# Patient Record
Sex: Female | Born: 1970 | Race: White | Hispanic: No | Marital: Married | Smoking: Never smoker
Health system: Southern US, Community
[De-identification: ages and names within clinical notes are randomized; demographics above are authoritative.]

## PROBLEM LIST (undated history)

## (undated) DIAGNOSIS — D649 Anemia, unspecified: Secondary | ICD-10-CM

## (undated) DIAGNOSIS — E079 Disorder of thyroid, unspecified: Secondary | ICD-10-CM

## (undated) HISTORY — DX: Anemia, unspecified: D64.9

## (undated) HISTORY — DX: Disorder of thyroid, unspecified: E07.9

## (undated) HISTORY — PX: BREAST BIOPSY: SHX20

---

## 2014-01-25 ENCOUNTER — Other Ambulatory Visit (HOSPITAL_COMMUNITY)
Admission: RE | Admit: 2014-01-25 | Discharge: 2014-01-25 | Disposition: A | Discharge status: Home / Self Care | Payer: BC Managed Care – PPO | Source: Ambulatory Visit | Attending: Obstetrics and Gynecology | Admitting: Obstetrics and Gynecology

## 2014-01-25 ENCOUNTER — Other Ambulatory Visit: Payer: Self-pay | Admitting: Obstetrics and Gynecology

## 2014-01-25 ENCOUNTER — Other Ambulatory Visit: Payer: Self-pay

## 2014-01-25 DIAGNOSIS — Z1151 Encounter for screening for human papillomavirus (HPV): Secondary | ICD-10-CM | POA: Insufficient documentation

## 2014-01-25 DIAGNOSIS — Z01419 Encounter for gynecological examination (general) (routine) without abnormal findings: Secondary | ICD-10-CM | POA: Diagnosis present

## 2014-01-25 DIAGNOSIS — Z1239 Encounter for other screening for malignant neoplasm of breast: Secondary | ICD-10-CM

## 2014-01-26 LAB — CYTOLOGY - PAP

## 2014-02-18 ENCOUNTER — Ambulatory Visit: Payer: Self-pay

## 2014-03-11 ENCOUNTER — Ambulatory Visit
Admission: RE | Admit: 2014-03-11 | Discharge: 2014-03-11 | Disposition: A | Discharge status: Home / Self Care | Payer: BC Managed Care – PPO | Source: Ambulatory Visit

## 2014-03-11 DIAGNOSIS — Z1239 Encounter for other screening for malignant neoplasm of breast: Secondary | ICD-10-CM

## 2014-03-12 ENCOUNTER — Ambulatory Visit (INDEPENDENT_AMBULATORY_CARE_PROVIDER_SITE_OTHER): Payer: BC Managed Care – PPO | Admitting: Internal Medicine

## 2014-03-12 ENCOUNTER — Encounter: Payer: Self-pay | Admitting: Internal Medicine

## 2014-03-12 VITALS — BP 102/68 | HR 80 | Temp 98.1°F | Resp 12 | Ht 64.0 in | Wt 130.0 lb

## 2014-03-12 DIAGNOSIS — E063 Autoimmune thyroiditis: Secondary | ICD-10-CM

## 2014-03-12 NOTE — Progress Notes (Signed)
Patient ID: Karen Guzman, female   DOB: 03/28/1971, 43 y.o.   MRN: [id number]   HPI  Karen Guzman is a 43 y.o.-year-old female, referred by her PCP, Dr. Dion BodyVarnado, for management of Hashimoto's hypothyroidism.  Pt. has been dx with hypothyroidism in 01/2014; not on Levothyroxine yet since asymptomatic.  I reviewed pt's thyroid tests: 02/01/2014:  TSH 4.89, TT4 7.8 TPO Abs 112 (<34) ATA 5.0 (<0.9)  Pt denies: - weight gain - fatigue - cold intolerancef - depression - constipation - dry skin - hair falling  She has regular, but heavy menses. She is on iron supplements.  Pt denies feeling nodules in neck, hoarseness, dysphagia/odynophagia, SOB with lying down.  She has + FH of thyroid disorders in: father, aunts, PGM. No FH of thyroid cancer.  No h/o radiation tx to head or neck. No recent use of iodine supplements.  ROS: Constitutional: no weight gain/loss, no fatigue, no subjective hyperthermia/hypothermia Eyes: no blurry vision, no xerophthalmia ENT: no sore throat, no nodules palpated in throat, no dysphagia/odynophagia, no hoarseness Cardiovascular: no CP/SOB/palpitations/leg swelling Respiratory: no cough/SOB Gastrointestinal: no N/V/D/C Musculoskeletal: no muscle/joint aches Skin: no rashes Neurological: no tremors/numbness/tingling/dizziness Psychiatric: no depression/anxiety  Past medical history: - Heavy menstrual cycles with the subsequent anemia  Past surgical history: - C-section in 2007 and 2009  History   Social History  . Marital Status: Married    Spouse Name: N/A    Number of Children: 2   Occupational History  .  stay at home mom    Social History Main Topics  . Smoking status: Never Smoker   . Smokeless tobacco: Not on file  . Alcohol Use:     3 -4 wine drinks per week  . Drug Use: No   Current Outpatient Rx  Name  Route  Sig  Dispense  Refill  . Fe Fum-FePoly-Vit C-Vit B3 (INTEGRA) 62.5-62.5-40-3 MG CAPS   Oral   Take 1 capsule  by mouth daily.      5   . tranexamic acid (LYSTEDA) 650 MG TABS tablet            11    Allergies  Allergen Reactions  . Penicillins Rash   Family history: - Negative for diabetes and hypertension - Positive for hyperlipidemia in father - Negative for heart problems - Positive for thyroid problems as mentioned in the history of present illness  PE: BP 102/68 mmHg  Pulse 80  Temp(Src) 98.1 F (36.7 C) (Oral)  Resp 12  Ht 5\' 4"  (1.626 m)  Wt 130 lb (58.968 kg)  BMI 22.30 kg/m2  SpO2 97%  LMP 03/02/2014 Wt Readings from Last 3 Encounters:  03/12/14 130 lb (58.968 kg)   Constitutional: Normal weight, in NAD Eyes: PERRLA, EOMI, no exophthalmos ENT: moist mucous membranes, no thyromegaly, no cervical lymphadenopathy Cardiovascular: RRR, No MRG Respiratory: CTA B Gastrointestinal: abdomen soft, NT, ND, BS+ Musculoskeletal: no deformities, strength intact in all 4 Skin: moist, warm, no rashes Neurological: no tremor with outstretched hands, DTR normal in all 4  ASSESSMENT: 1. Hashimoto's Hypothyroidism - Subclinical  PLAN:  1. Patient with newly diagnosed Hashimoto's subclinical hypothyroidism, not on levothyroxine therapy yet. She appears euthyroid and does not have any complaints. She does not appear to have a goiter, thyroid nodules, or neck compression symptoms. - We discussed about what Hashimoto's thyroiditis means; expected variability in her thyroid tests especially after diagnosis; treatment options - We also discussed about thresholds for treatment in subclinical hypothyroidism. I explained that without symptoms,  and without a TSH that higher than 10, thyroid hormones are not mandated for now. We definitely need to keep an eye on her thyroid tests since they can worsen due to the high antibody titer. - We discussed about correct intake of levothyroxine, if we need to start this in the future, fasting, with water, separated by at least 30 minutes from breakfast,  and separated by more than 4 hours from calcium, iron, multivitamins, acid reflux medications (PPIs). - will check thyroid tests in 04/2014, per her preference, since her insurance does not cover labs until the end of the year: TSH, free T4 - I will see her back in 6 months  - time spent with the patient: 45 min, of which >50% was spent in obtaining information about her symptoms, reviewing her previous labs, evaluations, and treatments, counseling her about her condition (please see the discussed topics above), and developing a plan to further investigate it. She had a number of questions which I addressed.

## 2014-03-12 NOTE — Patient Instructions (Signed)
Please come back for labs in 04/2014. Please come back for a follow-up appointment in 6 months. Let me know if you develop the followings:  Hypothyroidism The thyroid is a large gland located in the lower front of your neck. The thyroid gland helps control metabolism. Metabolism is how your body handles food. It controls metabolism with the hormone thyroxine. When this gland is underactive (hypothyroid), it produces too little hormone.  CAUSES These include:   Absence or destruction of thyroid tissue.  Goiter due to iodine deficiency.  Goiter due to medications.  Congenital defects (since birth).  Problems with the pituitary. This causes a lack of TSH (thyroid stimulating hormone). This hormone tells the thyroid to turn out more hormone. SYMPTOMS  Lethargy (feeling as though you have no energy)  Cold intolerance  Weight gain (in spite of normal food intake)  Dry skin  Coarse hair  Menstrual irregularity (if severe, may lead to infertility)  Slowing of thought processes Cardiac problems are also caused by insufficient amounts of thyroid hormone. Hypothyroidism in the newborn is cretinism, and is an extreme form. It is important that this form be treated adequately and immediately or it will lead rapidly to retarded physical and mental development. DIAGNOSIS  To prove hypothyroidism, your caregiver may do blood tests and ultrasound tests. Sometimes the signs are hidden. It may be necessary for your caregiver to watch this illness with blood tests either before or after diagnosis and treatment. TREATMENT  Low levels of thyroid hormone are increased by using synthetic thyroid hormone. This is a safe, effective treatment. It usually takes about four weeks to gain the full effects of the medication. After you have the full effect of the medication, it will generally take another four weeks for problems to leave. Your caregiver may start you on low doses. If you have had heart problems  the dose may be gradually increased. It is generally not an emergency to get rapidly to normal. HOME CARE INSTRUCTIONS   Take your medications as your caregiver suggests. Let your caregiver know of any medications you are taking or start taking. Your caregiver will help you with dosage schedules.  As your condition improves, your dosage needs may increase. It will be necessary to have continuing blood tests as suggested by your caregiver.  Report all suspected medication side effects to your caregiver. SEEK MEDICAL CARE IF: Seek medical care if you develop:  Sweating.  Tremulousness (tremors).  Anxiety.  Rapid weight loss.  Heat intolerance.  Emotional swings.  Diarrhea.  Weakness. SEEK IMMEDIATE MEDICAL CARE IF:  You develop chest pain, an irregular heart beat (palpitations), or a rapid heart beat. MAKE SURE YOU:   Understand these instructions.  Will watch your condition.  Will get help right away if you are not doing well or get worse. Document Released: 04/09/2005 Document Revised: 07/02/2011 Document Reviewed: 11/28/2007 [facility] Patient Information 2015 [place], [place]. This information is not intended to replace advice given to you by your health care provider. Make sure you discuss any questions you have with your health care provider.

## 2014-03-15 ENCOUNTER — Other Ambulatory Visit: Payer: Self-pay | Admitting: Obstetrics and Gynecology

## 2014-03-15 DIAGNOSIS — R928 Other abnormal and inconclusive findings on diagnostic imaging of breast: Secondary | ICD-10-CM

## 2014-03-17 ENCOUNTER — Encounter: Payer: Self-pay | Admitting: Internal Medicine

## 2014-03-29 ENCOUNTER — Ambulatory Visit
Admission: RE | Admit: 2014-03-29 | Discharge: 2014-03-29 | Disposition: A | Discharge status: Home / Self Care | Payer: BC Managed Care – PPO | Source: Ambulatory Visit | Attending: Obstetrics and Gynecology | Admitting: Obstetrics and Gynecology

## 2014-03-29 DIAGNOSIS — R928 Other abnormal and inconclusive findings on diagnostic imaging of breast: Secondary | ICD-10-CM

## 2014-04-30 ENCOUNTER — Other Ambulatory Visit: Payer: BLUE CROSS/BLUE SHIELD

## 2014-05-11 ENCOUNTER — Other Ambulatory Visit (INDEPENDENT_AMBULATORY_CARE_PROVIDER_SITE_OTHER): Payer: BLUE CROSS/BLUE SHIELD

## 2014-05-11 ENCOUNTER — Other Ambulatory Visit: Payer: Self-pay | Admitting: *Deleted

## 2014-05-11 DIAGNOSIS — E063 Autoimmune thyroiditis: Secondary | ICD-10-CM

## 2014-05-11 LAB — T4, FREE: Free T4: 0.81 ng/dL (ref 0.60–1.60)

## 2014-05-11 LAB — TSH: TSH: 5.07 u[IU]/mL — ABNORMAL HIGH (ref 0.35–4.50)

## 2014-05-11 LAB — T3, FREE: T3, Free: 3.2 pg/mL (ref 2.3–4.2)

## 2014-09-10 ENCOUNTER — Ambulatory Visit (INDEPENDENT_AMBULATORY_CARE_PROVIDER_SITE_OTHER): Payer: BLUE CROSS/BLUE SHIELD | Admitting: Internal Medicine

## 2014-09-10 ENCOUNTER — Encounter: Payer: Self-pay | Admitting: Internal Medicine

## 2014-09-10 VITALS — BP 122/80 | HR 81 | Temp 97.8°F | Resp 19 | Wt 120.0 lb

## 2014-09-10 DIAGNOSIS — Z7189 Other specified counseling: Secondary | ICD-10-CM

## 2014-09-10 DIAGNOSIS — E063 Autoimmune thyroiditis: Secondary | ICD-10-CM | POA: Diagnosis not present

## 2014-09-10 DIAGNOSIS — Z7689 Persons encountering health services in other specified circumstances: Secondary | ICD-10-CM

## 2014-09-10 NOTE — Patient Instructions (Signed)
Please stop at the lab. Please come back in 6 months for labs and in 1 year for a visit.  

## 2014-09-10 NOTE — Progress Notes (Signed)
Patient ID: Karen HoraKristin Stephanie, female   DOB: 01/22/1971, 44 y.o.   MRN: [id number]   HPI  Karen Guzman is a 44 y.o.-year-old female, returning for f/u for Hashimoto's hypothyroidism. Last visit 6 mo ago.   She started Isogenix shakes and bars - replacing 2/3 meals a day >> she lost 10 lbs.  Reviewed and addended hx: Pt. has been dx with hypothyroidism in 01/2014; not on Levothyroxine yet since asymptomatic.  I reviewed pt's thyroid tests: Lab Results  Component Value Date   TSH 5.07* 05/11/2014   FREET4 0.81 05/11/2014   02/01/2014:  TSH 4.89, TT4 7.8 TPO Abs 112 (<34) ATA 5.0 (<0.9)  Pt denies: - weight gain - fatigue - cold intolerancef - depression - constipation - dry skin - hair falling  She has regular, but heavy menses. She is on iron supplements.  Pt denies feeling nodules in neck, hoarseness, dysphagia/odynophagia, SOB with lying down.  ROS: Constitutional: no weight gain/loss, no fatigue, no subjective hyperthermia/hypothermia Eyes: no blurry vision, no xerophthalmia ENT: no sore throat, no nodules palpated in throat, no dysphagia/odynophagia, no hoarseness Cardiovascular: no CP/SOB/palpitations/leg swelling Respiratory: no cough/SOB Gastrointestinal: no N/V/D/C Musculoskeletal: no muscle/joint aches Skin: no rashes Neurological: no tremors/numbness/tingling/dizziness  I reviewed pt's medications, allergies, PMH, social hx, family hx, and changes were documented in the history of present illness. Otherwise, unchanged from my initial visit note.  Past medical history: - Heavy menstrual cycles with subsequent anemia  Past surgical history: - C-section in 2007 and 2009  History   Social History  . Marital Status: Married    Spouse Name: N/A    Number of Children: 2   Occupational History  .  stay at home mom    Social History Main Topics  . Smoking status: Never Smoker   . Smokeless tobacco: Not on file  . Alcohol Use:     3 -4 wine drinks per  week  . Drug Use: No   Current Outpatient Rx  Name  Route  Sig  Dispense  Refill  . Fe Fum-FePoly-Vit C-Vit B3 (INTEGRA) 62.5-62.5-40-3 MG CAPS   Oral   Take 1 capsule by mouth daily.      5   . tranexamic acid (LYSTEDA) 650 MG TABS tablet            11    Allergies  Allergen Reactions  . Penicillins Rash   Family history: - Negative for diabetes and hypertension - Positive for hyperlipidemia in father - Negative for heart problems - Positive for thyroid problems as mentioned in the history of present illness  PE: BP 122/80 mmHg  Pulse 81  Temp(Src) 97.8 F (36.6 C) (Oral)  Resp 19  Wt 120 lb (54.432 kg)  SpO2 98% Body mass index is 20.59 kg/(m^2). Wt Readings from Last 3 Encounters:  09/10/14 120 lb (54.432 kg)  03/12/14 130 lb (58.968 kg)   Constitutional: Normal weight, in NAD Eyes: PERRLA, EOMI, no exophthalmos ENT: moist mucous membranes, + symmetric, non-nodular, thyromegaly, no cervical lymphadenopathy Cardiovascular: RRR, No MRG Respiratory: CTA B Gastrointestinal: abdomen soft, NT, ND, BS+ Musculoskeletal: no deformities, strength intact in all 4 Skin: moist, warm, no rashes Neurological: no tremor with outstretched hands, DTR normal in all 4  ASSESSMENT: 1. Hashimoto's Hypothyroidism - Subclinical  PLAN:  1. Patient with mild Hashimoto's subclinical hypothyroidism, not on levothyroxine therapy yet. She appears euthyroid and does not have any complaints. 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 -  We discussed about what Hashimoto's thyroiditis means - We also discussed about thresholds for treatment in subclinical hypothyroidism. I explained that without symptoms, and without a TSH that higher than 10, thyroid hormones are not mandated for now, unless she desires a pregnancy. She was thinking about this and will let me know if she decides for it.  - For now, we'll recheck her thyroid tests: TSH,  free T4, free T3 - I will see her back in 1 year, but will recheck her labs in 6 months  Component     Latest Ref Rng 09/15/2014  TSH     0.350 - 4.500 uIU/mL 3.016  Free T4     0.80 - 1.80 ng/dL 1.611.44  T3, Free     2.3 - 4.2 pg/mL 2.8   TFTs normal. No treatment needed for now.

## 2014-09-15 ENCOUNTER — Other Ambulatory Visit: Payer: BLUE CROSS/BLUE SHIELD

## 2014-09-15 DIAGNOSIS — E063 Autoimmune thyroiditis: Secondary | ICD-10-CM

## 2014-09-15 LAB — T4, FREE: Free T4: 1.44 ng/dL (ref 0.80–1.80)

## 2014-09-15 LAB — T3, FREE: T3 FREE: 2.8 pg/mL (ref 2.3–4.2)

## 2014-09-15 LAB — TSH: TSH: 3.016 u[IU]/mL (ref 0.350–4.500)

## 2014-10-07 ENCOUNTER — Ambulatory Visit: Payer: BLUE CROSS/BLUE SHIELD | Admitting: Family

## 2014-11-09 ENCOUNTER — Ambulatory Visit: Payer: BLUE CROSS/BLUE SHIELD | Admitting: Internal Medicine

## 2014-11-19 ENCOUNTER — Ambulatory Visit: Payer: BLUE CROSS/BLUE SHIELD | Admitting: Internal Medicine

## 2014-12-08 ENCOUNTER — Encounter: Payer: Self-pay | Admitting: Internal Medicine

## 2014-12-08 ENCOUNTER — Other Ambulatory Visit (INDEPENDENT_AMBULATORY_CARE_PROVIDER_SITE_OTHER): Payer: BLUE CROSS/BLUE SHIELD

## 2014-12-08 ENCOUNTER — Ambulatory Visit (INDEPENDENT_AMBULATORY_CARE_PROVIDER_SITE_OTHER): Payer: BLUE CROSS/BLUE SHIELD | Admitting: Internal Medicine

## 2014-12-08 VITALS — BP 100/66 | HR 75 | Temp 98.4°F | Resp 12 | Ht 64.0 in | Wt 125.0 lb

## 2014-12-08 DIAGNOSIS — D500 Iron deficiency anemia secondary to blood loss (chronic): Secondary | ICD-10-CM

## 2014-12-08 DIAGNOSIS — E063 Autoimmune thyroiditis: Secondary | ICD-10-CM

## 2014-12-08 DIAGNOSIS — Z0000 Encounter for general adult medical examination without abnormal findings: Secondary | ICD-10-CM

## 2014-12-08 LAB — VITAMIN B12: Vitamin B-12: 294 pg/mL (ref 211–911)

## 2014-12-08 LAB — COMPREHENSIVE METABOLIC PANEL
ALBUMIN: 4.1 g/dL (ref 3.5–5.2)
ALK PHOS: 51 U/L (ref 39–117)
ALT: 12 U/L (ref 0–35)
AST: 18 U/L (ref 0–37)
BUN: 15 mg/dL (ref 6–23)
CHLORIDE: 104 meq/L (ref 96–112)
CO2: 27 mEq/L (ref 19–32)
Calcium: 9.4 mg/dL (ref 8.4–10.5)
Creatinine, Ser: 0.7 mg/dL (ref 0.40–1.20)
GFR: 96.56 mL/min (ref >60.00)
Glucose, Bld: 69 mg/dL — ABNORMAL LOW (ref 70–99)
POTASSIUM: 4.4 meq/L (ref 3.5–5.1)
SODIUM: 139 meq/L (ref 135–145)
Total Bilirubin: 0.4 mg/dL (ref 0.2–1.2)
Total Protein: 6.8 g/dL (ref 6.0–8.3)

## 2014-12-08 LAB — CBC
HEMATOCRIT: 41 % (ref 36.0–46.0)
Hemoglobin: 13.7 g/dL (ref 12.0–15.0)
MCHC: 33.4 g/dL (ref 30.0–36.0)
MCV: 89.8 fl (ref 78.0–100.0)
Platelets: 290 10*3/uL (ref 150.0–400.0)
RBC: 4.57 Mil/uL (ref 3.87–5.11)
RDW: 12.6 % (ref 11.5–15.5)
WBC: 8.1 10*3/uL (ref 4.0–10.5)

## 2014-12-08 LAB — FERRITIN: FERRITIN: 24.3 ng/mL (ref 10.0–291.0)

## 2014-12-08 LAB — LIPID PANEL
CHOLESTEROL: 153 mg/dL (ref 0–200)
HDL: 31.1 mg/dL — AB (ref >39.00)
LDL CALC: 90 mg/dL (ref 0–99)
NONHDL: 122.28
Total CHOL/HDL Ratio: 5
Triglycerides: 160 mg/dL — ABNORMAL HIGH (ref 0.0–149.0)
VLDL: 32 mg/dL (ref 0.0–40.0)

## 2014-12-08 LAB — T4, FREE: Free T4: 0.81 ng/dL (ref 0.60–1.60)

## 2014-12-08 LAB — TSH: TSH: 4.48 u[IU]/mL (ref 0.35–4.50)

## 2014-12-08 NOTE — Patient Instructions (Signed)
We are checking your labs today and will call you back with the results.

## 2014-12-08 NOTE — Progress Notes (Signed)
Pre visit review using our clinic review tool, if applicable. No additional management support is needed unless otherwise documented below in the visit note. 

## 2014-12-09 ENCOUNTER — Encounter: Payer: Self-pay | Admitting: Internal Medicine

## 2014-12-09 DIAGNOSIS — D509 Iron deficiency anemia, unspecified: Secondary | ICD-10-CM | POA: Insufficient documentation

## 2014-12-09 NOTE — Assessment & Plan Note (Signed)
Does have reports of heavy menstrual bleeding which has led to mild iron deficiency since puberty. She does not take her iron pills often. Check CBC, B12, ferritin today. No features that would suggest hemoglobinopathy. She is currently taking lysteda to reduce bleeding during cycles.

## 2014-12-09 NOTE — Assessment & Plan Note (Signed)
Seeing endocrinology and currently euthyroid. Checking TSH and free T4 today.

## 2014-12-09 NOTE — Progress Notes (Signed)
   Subjective:    Patient ID: Karen Guzman, female    DOB: 23-Oct-1970, 44 y.o.   MRN: [id number]  HPI The patient is a 44 YO female coming in new for anemia. She has had it since adulthood. She feels that she has heavy periods but she has been checked for fibroids recently by her Ob/Gyn and does not have. She is supposed to take iron pills but does not remember that often. She denies SOB or chest pains. She does feel tired some of the time. She has history of thyroid problems but is currently with normal thyroid levels off medication and is seeing endocrinology for that. No other acute complaints today. No history of excessive bleeding in joints or with dental procedures. No family history of any blood dyscrasias.   PMH, [facility], social history reviewed and updated.   Review of Systems  Constitutional: Positive for fatigue. Negative for fever, activity change, appetite change and unexpected weight change.  HENT: Negative.   Eyes: Negative.   Respiratory: Negative for cough, chest tightness, shortness of breath and wheezing.   Cardiovascular: Negative for chest pain, palpitations and leg swelling.  Gastrointestinal: Negative for nausea, abdominal pain, diarrhea, constipation, blood in stool and abdominal distention.  Musculoskeletal: Negative.   Skin: Negative.   Neurological: Negative.   Hematological: Negative.   Psychiatric/Behavioral: Negative.       Objective:   Physical Exam  Constitutional: She is oriented to person, place, and time. She appears well-developed and well-nourished.  HENT:  Head: Normocephalic and atraumatic.  Eyes: EOM are normal.  Neck: Normal range of motion.  Cardiovascular: Normal rate and regular rhythm.   No murmur heard. Pulmonary/Chest: Effort normal and breath sounds normal. No respiratory distress. She has no wheezes. She has no rales.  Abdominal: Soft. Bowel sounds are normal. She exhibits no distension. There is no tenderness. There is no rebound.    Musculoskeletal: She exhibits no edema.  Neurological: She is alert and oriented to person, place, and time. Coordination normal.  Skin: Skin is warm and dry.  Psychiatric: She has a normal mood and affect.   Filed Vitals:   12/08/14 0808  BP: 100/66  Pulse: 75  Temp: 98.4 F (36.9 C)  TempSrc: Oral  Resp: 12  Height:  (1.626 m)  Weight: 125 lb (56.7 kg)  SpO2: 98%      Assessment & Plan:

## 2015-03-01 ENCOUNTER — Encounter: Payer: Self-pay | Admitting: Family

## 2015-03-01 ENCOUNTER — Ambulatory Visit (INDEPENDENT_AMBULATORY_CARE_PROVIDER_SITE_OTHER): Payer: BLUE CROSS/BLUE SHIELD | Admitting: Family

## 2015-03-01 VITALS — BP 108/70 | HR 99 | Temp 98.2°F | Resp 18 | Ht 64.0 in | Wt 120.0 lb

## 2015-03-01 DIAGNOSIS — B349 Viral infection, unspecified: Secondary | ICD-10-CM | POA: Diagnosis not present

## 2015-03-01 NOTE — Assessment & Plan Note (Signed)
Symptoms and exam consistent with a viral syndrome. Influenza test was negative. Recommend conservative treatment with over the counter medications. Follow up if symptoms worsen or fail to improve.

## 2015-03-01 NOTE — Patient Instructions (Addendum)
Thank you for choosing [facility].  Summary/Instructions:  If your symptoms worsen or fail to improve, please contact our office for further instruction, or in case of emergency go directly to the emergency room at the closest medical facility.   General Recommendations:    Please drink plenty of fluids.  Get plenty of rest   Sleep in humidified air  Use saline nasal sprays  Netti pot   OTC Medications:  Decongestants - helps relieve congestion   Flonase (generic fluticasone) or Nasacort (generic triamcinolone) - please make sure to use the "cross-over" technique at a 45 degree angle towards the opposite eye as opposed to straight up the nasal passageway.   Sudafed (generic pseudoephedrine - Note this is the one that is available behind the pharmacy counter); Products with phenylephrine (-PE) may also be used but is often not as effective as pseudoephedrine.   If you have HIGH BLOOD PRESSURE - Coricidin HBP; AVOID any product that is -D as this contains pseudoephedrine which may increase your blood pressure.  Afrin (oxymetazoline) every 6-8 hours for up to 3 days.   Allergies - helps relieve runny nose, itchy eyes and sneezing   Claritin (generic loratidine), Allegra (fexofenidine), or Zyrtec (generic cyrterizine) for runny nose. These medications should not cause drowsiness.  Note - Benadryl (generic diphenhydramine) may be used however may cause drowsiness  Cough -   Delsym or Robitussin (generic dextromethorphan)  Expectorants - helps loosen mucus to ease removal   Mucinex (generic guaifenesin) as directed on the package.  Headaches / General Aches   Tylenol (generic acetaminophen) - DO NOT EXCEED 3 grams (3,000 mg) in a 24 hour time period  Advil/Motrin (generic ibuprofen)   Sore Throat -   Salt water gargle   Chloraseptic (generic benzocaine) spray or lozenges / Sucrets (generic dyclonine)       

## 2015-03-01 NOTE — Progress Notes (Signed)
   Subjective:    Patient ID: Karen Guzman, female    DOB: 07/28/1970, 44 y.o.   MRN: [id number]  Chief Complaint  Patient presents with  . Joint Pain    body aches, headache, chills, joint pain, started on sunday    HPI:  Karen Guzman is a 44 y.o. female who  has a past medical history of Anemia and Thyroid disease. and presents today for an acute office visit.   This is a new problem. Associated symptoms of body aches, headache, chills, and joint pain started approximately 3 days ago. Also notes some elbow pain that.  Modifying factors include aspirin which has provided a little relief. Reports that she does not feel as bad as she did yesterday or last evening. Denies any recent antibiotics. Denies anyone around her being sick.    Allergies  Allergen Reactions  . Penicillins Rash     No current outpatient prescriptions on file prior to visit.   No current facility-administered medications on file prior to visit.    Review of Systems  Constitutional: Positive for chills. Negative for fever.  HENT: Negative for congestion, ear pain, sinus pressure and sore throat.   Respiratory: Negative for chest tightness and shortness of breath.   Musculoskeletal: Positive for arthralgias.  Neurological: Positive for headaches.      Objective:    BP 108/70 mmHg  Pulse 99  Temp(Src) 98.2 F (36.8 C) (Oral)  Resp 18  Ht 5\' 4"  (1.626 m)  Wt 120 lb (54.432 kg)  BMI 20.59 kg/m2  SpO2 97% Nursing note and vital signs reviewed.  Physical Exam  Constitutional: She is oriented to person, place, and time. She appears well-developed and well-nourished. No distress.  HENT:  Right Ear: Hearing, tympanic membrane, external ear and ear canal normal.  Left Ear: Hearing, tympanic membrane, external ear and ear canal normal.  Nose: Nose normal. Right sinus exhibits no maxillary sinus tenderness and no frontal sinus tenderness. Left sinus exhibits no maxillary sinus tenderness and no frontal  sinus tenderness.  Mouth/Throat: Uvula is midline and mucous membranes are normal.  Small amount of cobblestoning noted in back of throat.   Cardiovascular: Normal rate, regular rhythm, normal heart sounds and intact distal pulses.   Pulmonary/Chest: Effort normal and breath sounds normal.  Neurological: She is alert and oriented to person, place, and time.  Skin: Skin is warm and dry.  Psychiatric: She has a normal mood and affect. Her behavior is normal. Judgment and thought content normal.       Assessment & Plan:   Problem List Items Addressed This Visit      Other   Viral syndrome - Primary    Symptoms and exam consistent with a viral syndrome. Influenza test was negative. Recommend conservative treatment with over the counter medications. Follow up if symptoms worsen or fail to improve.       Relevant Orders   POCT Influenza A/B

## 2015-03-01 NOTE — Progress Notes (Signed)
Pre visit review using our clinic review tool, if applicable. No additional management support is needed unless otherwise documented below in the visit note. 

## 2015-03-14 ENCOUNTER — Other Ambulatory Visit (INDEPENDENT_AMBULATORY_CARE_PROVIDER_SITE_OTHER): Payer: BLUE CROSS/BLUE SHIELD

## 2015-03-14 DIAGNOSIS — E063 Autoimmune thyroiditis: Secondary | ICD-10-CM | POA: Diagnosis not present

## 2015-03-14 LAB — TSH: TSH: 1.82 u[IU]/mL (ref 0.35–4.50)

## 2015-03-14 LAB — T3, FREE: T3, Free: 3 pg/mL (ref 2.3–4.2)

## 2015-03-14 LAB — T4, FREE: Free T4: 0.87 ng/dL (ref 0.60–1.60)

## 2015-04-01 ENCOUNTER — Other Ambulatory Visit: Payer: Self-pay

## 2015-04-01 DIAGNOSIS — Z1231 Encounter for screening mammogram for malignant neoplasm of breast: Secondary | ICD-10-CM

## 2015-05-04 ENCOUNTER — Ambulatory Visit: Payer: BLUE CROSS/BLUE SHIELD

## 2015-05-05 ENCOUNTER — Ambulatory Visit
Admission: RE | Admit: 2015-05-05 | Discharge: 2015-05-05 | Disposition: A | Discharge status: Home / Self Care | Payer: BLUE CROSS/BLUE SHIELD | Source: Ambulatory Visit

## 2015-05-05 DIAGNOSIS — Z1231 Encounter for screening mammogram for malignant neoplasm of breast: Secondary | ICD-10-CM

## 2015-09-12 ENCOUNTER — Ambulatory Visit (INDEPENDENT_AMBULATORY_CARE_PROVIDER_SITE_OTHER): Payer: BLUE CROSS/BLUE SHIELD | Admitting: Internal Medicine

## 2015-09-12 ENCOUNTER — Encounter: Payer: Self-pay | Admitting: Internal Medicine

## 2015-09-12 VITALS — BP 118/64 | HR 83 | Temp 98.1°F | Resp 12 | Wt 129.8 lb

## 2015-09-12 DIAGNOSIS — E049 Nontoxic goiter, unspecified: Secondary | ICD-10-CM

## 2015-09-12 DIAGNOSIS — E063 Autoimmune thyroiditis: Secondary | ICD-10-CM | POA: Diagnosis not present

## 2015-09-12 DIAGNOSIS — E038 Other specified hypothyroidism: Secondary | ICD-10-CM

## 2015-09-12 DIAGNOSIS — E010 Iodine-deficiency related diffuse (endemic) goiter: Secondary | ICD-10-CM

## 2015-09-12 LAB — T3, FREE: T3 FREE: 3.3 pg/mL (ref 2.3–4.2)

## 2015-09-12 LAB — T4, FREE: FREE T4: 0.83 ng/dL (ref 0.60–1.60)

## 2015-09-12 LAB — TSH: TSH: 4.34 u[IU]/mL (ref 0.35–4.50)

## 2015-09-12 NOTE — Patient Instructions (Signed)
Please stop at the lab.  Come back in 6 months for repeat thyroid tests.   We will call you to schedule the thyroid U/S. Please let me know in ~5 days if you are not called with the schedule.  Please return in 1 year.

## 2015-09-12 NOTE — Progress Notes (Addendum)
Patient ID: Karen Guzman, female   DOB: 10-09-1970, 45 y.o.   MRN: [id number]   HPI  Karen Guzman is a 45 y.o.-year-old female, returning for f/u for Hashimoto's hypothyroidism. Last visit 6 mo ago.   She started [employer] shakes and bars - relaxed diet since last visit.   Reviewed and addended hx: Pt. has been dx with hypothyroidism in 01/2014; not on Levothyroxine yet since asymptomatic.  I reviewed pt's thyroid tests: Lab Results  Component Value Date   TSH 1.82 03/14/2015   TSH 4.48 12/08/2014   TSH 3.016 09/15/2014   TSH 5.07* 05/11/2014   FREET4 0.87 03/14/2015   FREET4 0.81 12/08/2014   FREET4 1.44 09/15/2014   FREET4 0.81 05/11/2014   02/01/2014:  TSH 4.89, TT4 7.8 TPO Abs 112 (<34) ATA 5.0 (<0.9)  Pt denies: - weight gain - fatigue - cold intolerance - depression - constipation - dry skin - hair falling  She has regular, but heavy menses. She is on iron supplements.  Pt denies feeling nodules in neck, hoarseness, dysphagia/odynophagia, SOB with lying down.  ROS: Constitutional: no weight gain/loss, no fatigue, no subjective hyperthermia/hypothermia Eyes: no blurry vision, no xerophthalmia ENT: no sore throat, no nodules palpated in throat, no dysphagia/odynophagia, no hoarseness Cardiovascular: no CP/SOB/palpitations/leg swelling Respiratory: no cough/SOB Gastrointestinal: no N/V/D/C Musculoskeletal: no muscle/joint aches Skin: no rashes Neurological: no tremors/numbness/tingling/dizziness  I reviewed pt's medications, allergies, PMH, social hx, family hx, and changes were documented in the history of present illness. Otherwise, unchanged from my initial visit note.  Past medical history: - Heavy menstrual cycles with subsequent anemia  Past surgical history: - C-section in 2007 and 2009  History   Social History  . Marital Status: Married    Spouse Name: N/A    Number of Children: 2   Occupational History  .  stay at home mom    Social  History Main Topics  . Smoking status: Never Smoker   . Smokeless tobacco: Not on file  . Alcohol Use:     3 -4 wine drinks per week  . Drug Use: No   No current outpatient prescriptions on file. Allergies  Allergen Reactions  . Penicillins Rash   Family history: - Negative for diabetes and hypertension - Positive for hyperlipidemia in father - Negative for heart problems - Positive for thyroid problems as mentioned in the history of present illness  PE: BP 118/64 mmHg  Pulse 83  Temp(Src) 98.1 F (36.7 C) (Oral)  Resp 12  Wt 129 lb 12.8 oz (58.877 kg)  SpO2 98% Body mass index is 22.27 kg/(m^2). Wt Readings from Last 3 Encounters:  09/12/15 129 lb 12.8 oz (58.877 kg)  03/01/15 120 lb (54.432 kg)  12/08/14 125 lb (56.7 kg)   Constitutional: Normal weight, in NAD Eyes: PERRLA, EOMI, no exophthalmos ENT: moist mucous membranes, + symmetric, non-nodular, thyromegaly, no cervical lymphadenopathy Cardiovascular: RRR, No MRG Respiratory: CTA B Gastrointestinal: abdomen soft, NT, ND, BS+ Musculoskeletal: no deformities, strength intact in all 4 Skin: moist, warm, no rashes Neurological: no tremor with outstretched hands, DTR normal in all 4  ASSESSMENT: 1. Hashimoto's Hypothyroidism  2. Thyromegaly  PLAN:  1. Patient with mild Hashimoto's subclinical hypothyroidism, not on levothyroxine therapy yet. She appears euthyroid and does not have any complaints. 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 - We discussed about what Hashimoto's thyroiditis means - thresholds for treatment in subclinical hypothyroidism:  TSH that higher than 10  Signs  and sxs of hypothyroidism  She desires a pregnancy - For now, we'll recheck her thyroid tests: TSH, free T4, free T3 - I will see her back in 1 year, but will recheck her labs in 6 months  2. Thyromegaly - she is concerned >> will check a thyroid U/S  Office Visit on  09/12/2015  Component Date Value Ref Range Status  . T3, Free 09/12/2015 3.3  2.3 - 4.2 pg/mL Final  . Free T4 09/12/2015 0.83  0.60 - 1.60 ng/dL Final  . TSH 82/95/621305/22/2017 4.34  0.35 - 4.50 uIU/mL Final   TFTs normal.  CLINICAL DATA: Thyromegaly, Hashimoto's thyroiditis  EXAM: THYROID ULTRASOUND  TECHNIQUE: Ultrasound examination of the thyroid gland and adjacent soft tissues was performed.  COMPARISON: None.  FINDINGS: Right thyroid lobe  Measurements: 5.9 x 1.9 x 1.8 cm. Inhomogeneous parenchyma without focal nodule or other focal lesion. Mild hyperemia.  Left thyroid lobe  Measurements: 5 x 1.7 x 1.6 cm. No nodules visualized.  Isthmus  Thickness: 0.3 cm. No nodules visualized.  Lymphadenopathy  None visualized. Bilateral cervical nodes are identified, none greater than 7 mm short axis diameter.  IMPRESSION: 1. Mild thyromegaly without focal nodule.   Electronically Signed By: Corlis Leak Hassell M.D. On: 09/16/2015 13:35

## 2015-09-16 ENCOUNTER — Ambulatory Visit
Admission: RE | Admit: 2015-09-16 | Discharge: 2015-09-16 | Disposition: A | Discharge status: Home / Self Care | Payer: BLUE CROSS/BLUE SHIELD | Source: Ambulatory Visit | Attending: Internal Medicine | Admitting: Internal Medicine

## 2015-09-16 DIAGNOSIS — E063 Autoimmune thyroiditis: Secondary | ICD-10-CM | POA: Diagnosis not present

## 2015-09-16 DIAGNOSIS — E010 Iodine-deficiency related diffuse (endemic) goiter: Secondary | ICD-10-CM

## 2015-09-22 ENCOUNTER — Telehealth: Payer: Self-pay | Admitting: Internal Medicine

## 2015-09-22 NOTE — Telephone Encounter (Signed)
PT calling to get her lab and U/S results, requests call back

## 2015-09-23 NOTE — Telephone Encounter (Signed)
Karen Guzman, please start her to check her my chart - I sent her message her week ago about this:  Entered by Carlus Pavlovristina Lycan Davee, MD at 09/16/2015 1:46 PM    Dear Ms. Olena LeatherwoodJarrett  Your thyroid gland is slightly enlarged, but without any nodules or other worrisome lesions.  Sincerely,  Carlus Pavlovristina Taten Merrow MD     For  patients that call for results, check first if they have a my chart active, because chances are I have already send him the results through there and they did not look yet.

## 2015-09-23 NOTE — Telephone Encounter (Signed)
Left message for Ms. Karen Guzman to check her MyChart.  Dr. Lafe GarinGherge sent her the results last week.  Advised to call back if she has any questions.

## 2016-01-31 DIAGNOSIS — Z01419 Encounter for gynecological examination (general) (routine) without abnormal findings: Secondary | ICD-10-CM | POA: Diagnosis not present

## 2016-01-31 DIAGNOSIS — Z23 Encounter for immunization: Secondary | ICD-10-CM | POA: Diagnosis not present

## 2016-03-12 ENCOUNTER — Other Ambulatory Visit (INDEPENDENT_AMBULATORY_CARE_PROVIDER_SITE_OTHER): Payer: BLUE CROSS/BLUE SHIELD

## 2016-03-12 DIAGNOSIS — E038 Other specified hypothyroidism: Secondary | ICD-10-CM

## 2016-03-12 DIAGNOSIS — E063 Autoimmune thyroiditis: Secondary | ICD-10-CM

## 2016-03-12 LAB — TSH: TSH: 2.81 u[IU]/mL (ref 0.35–4.50)

## 2016-03-12 LAB — T3, FREE: T3 FREE: 3.2 pg/mL (ref 2.3–4.2)

## 2016-03-12 LAB — T4, FREE: FREE T4: 0.79 ng/dL (ref 0.60–1.60)

## 2016-03-14 ENCOUNTER — Other Ambulatory Visit: Payer: BLUE CROSS/BLUE SHIELD

## 2016-04-06 ENCOUNTER — Other Ambulatory Visit: Payer: Self-pay | Admitting: Obstetrics and Gynecology

## 2016-04-06 DIAGNOSIS — Z1231 Encounter for screening mammogram for malignant neoplasm of breast: Secondary | ICD-10-CM

## 2016-05-15 ENCOUNTER — Ambulatory Visit
Admission: RE | Admit: 2016-05-15 | Discharge: 2016-05-15 | Disposition: A | Discharge status: Home / Self Care | Payer: BLUE CROSS/BLUE SHIELD | Source: Ambulatory Visit | Attending: Obstetrics and Gynecology | Admitting: Obstetrics and Gynecology

## 2016-05-15 DIAGNOSIS — Z1231 Encounter for screening mammogram for malignant neoplasm of breast: Secondary | ICD-10-CM | POA: Diagnosis not present

## 2016-07-13 ENCOUNTER — Telehealth: Payer: Self-pay

## 2016-07-13 DIAGNOSIS — R5383 Other fatigue: Secondary | ICD-10-CM | POA: Diagnosis not present

## 2016-07-13 NOTE — Telephone Encounter (Signed)
Patient called, stated that she has recently started feeling very fatigued and she is becoming very forgetful. She went to the urgent care today and they did blood work and will have those results back tomorrow and will get them faxed over. She is going out of town next week for 2 weeks and was afraid that something is off. She just wants you to look over her labs and see if she needs to be on medicine and advise her what to do. Thank you!

## 2016-09-11 ENCOUNTER — Ambulatory Visit: Payer: BLUE CROSS/BLUE SHIELD | Admitting: Internal Medicine

## 2016-09-21 ENCOUNTER — Encounter: Payer: Self-pay | Admitting: Internal Medicine

## 2016-09-21 ENCOUNTER — Other Ambulatory Visit (INDEPENDENT_AMBULATORY_CARE_PROVIDER_SITE_OTHER): Payer: BLUE CROSS/BLUE SHIELD

## 2016-09-21 ENCOUNTER — Ambulatory Visit (INDEPENDENT_AMBULATORY_CARE_PROVIDER_SITE_OTHER): Payer: BLUE CROSS/BLUE SHIELD | Admitting: Internal Medicine

## 2016-09-21 VITALS — BP 93/62 | HR 78 | Wt 136.2 lb

## 2016-09-21 DIAGNOSIS — E063 Autoimmune thyroiditis: Secondary | ICD-10-CM | POA: Diagnosis not present

## 2016-09-21 LAB — T3, FREE: T3 FREE: 3.1 pg/mL (ref 2.3–4.2)

## 2016-09-21 LAB — T4, FREE: Free T4: 0.78 ng/dL (ref 0.60–1.60)

## 2016-09-21 LAB — TSH: TSH: 3.08 u[IU]/mL (ref 0.35–4.50)

## 2016-09-21 NOTE — Patient Instructions (Signed)
[facility] stop at [facility].  Please return in 1 year.

## 2016-09-21 NOTE — Progress Notes (Signed)
Patient ID: Karen HoraKristin Gordner, female   DOB: 12/25/1970, [age] y.o.   MRN: [id number]   HPI  Karen Guzman is a 46 y.o.-year-old female, returning for f/u for Hashimoto's hypothyroidism. Last visit 6 mo ago.   Reviewed and addended hx: Pt. has been dx with hypothyroidism in 01/2014; not on Levothyroxine yet since euthyroid.  I reviewed pt's thyroid tests: Lab Results  Component Value Date   TSH 2.81 03/12/2016   TSH 4.34 09/12/2015   TSH 1.82 03/14/2015   TSH 4.48 12/08/2014   TSH 3.016 09/15/2014   FREET4 0.79 03/12/2016   FREET4 0.83 09/12/2015   FREET4 0.87 03/14/2015   FREET4 0.81 12/08/2014   FREET4 1.44 09/15/2014   02/01/2014:  TSH 4.89, TT4 7.8 TPO Abs 112 (<34) ATA 5.0 (<0.9)  Pt denies - weight gain, although she did gain weight per our scale - cold intolerance - constipation - dry skin - hair loss  She did have an episode of increased fatigue in 06/2016.  + regular, but heavy menses. She is on iron supplements.  Pt denies: - feeling nodules in neck - hoarseness - dysphagia - choking - SOB with lying down  We checked a thyroid U/S (09/16/2015): Mild thyromegaly without focal nodule.   Since last visit, she had hives after eating out in a [place] restaurant. Last episode few mo ago.  ROS: Constitutional: + per HPI Eyes: no blurry vision, no xerophthalmia ENT: no sore throat, no nodules palpated in throat, no dysphagia, no odynophagia, no hoarseness Cardiovascular: no CP/no SOB/no palpitations/no leg swelling Respiratory: no cough/no SOB/no wheezing Gastrointestinal: no N/no V/no D/no C/no acid reflux Musculoskeletal: no muscle aches/no joint aches Skin: no rashes, no hair loss Neurological: no tremors/no numbness/no tingling/no dizziness  I reviewed pt's medications, allergies, PMH, social hx, family hx, and changes were documented in the history of present illness. Otherwise, unchanged from my initial visit note.  Past medical history: - Heavy  menstrual cycles with subsequent anemia  Past surgical history: - C-section in 2007 and 2009  History   Social History  . Marital Status: Married    Spouse Name: N/A    Number of Children: 2   Occupational History  .  stay at home mom    Social History Main Topics  . Smoking status: Never Smoker   . Smokeless tobacco: Not on file  . Alcohol Use:     3 -4 wine drinks per week  . Drug Use: No    Allergies  Allergen Reactions  . Penicillins Rash   Family history: - Negative for diabetes and hypertension - Positive for hyperlipidemia in father - Negative for heart problems - Positive for thyroid problems as mentioned in the history of present illness  PE: BP 93/62   Pulse 78   Wt 136 lb 3.2 oz (61.8 kg)   SpO2 96%   BMI 23.38 kg/m  Body mass index is 23.38 kg/m. Wt Readings from Last 3 Encounters:  09/21/16 136 lb 3.2 oz (61.8 kg)  09/12/15 129 lb 12.8 oz (58.9 kg)  03/01/15 120 lb (54.4 kg)   Constitutional:  Normal weight, in NAD Eyes: PERRLA, EOMI, no exophthalmos ENT: moist mucous membranes, + mild symmetric thyromegaly, no cervical lymphadenopathy Cardiovascular: RRR, No MRG Respiratory: CTA B Gastrointestinal: abdomen soft, NT, ND, BS+ Musculoskeletal: no deformities, strength intact in all 4 Skin: moist, warm, no rashes Neurological: no tremor with outstretched hands, DTR normal in all 4   ASSESSMENT: 1. Hashimoto's Hypothyroidism  2. Thyromegaly  PLAN:  1. Patient with mild Hashimoto's thyroiditis, in euthyroid phase. She had an episode of fatigue in 06/2016, but this resolved. She does not c/o weight gain but her weight is higher per our scale. No constipation, no fatigue now, no dry skin or hair loss. - we reviewed her latest thyroid U/S report >> mild goiter, w/o clinically signif. Nodules. - she denies neck compression sxs - thresholds for treatment in subclinical hypothyroidism: are:  TSH >10  Signs or sxs of hypothyroidism  Desire for  pregnancy - Ffor now, we can continue w/o treatment but may need to start LT4 depending on the results of her TFTs today - I will see her back in 1 year  2. Thyromegaly - no neck compression sxs - a thyroid U/S obtained 1 year ago >> no nodules: Mild thyromegaly without focal nodule.  Component     Latest Ref Rng & Units 09/21/2016  TSH     0.35 - 4.50 uIU/mL 3.08  T4,Free(Direct)     0.60 - 1.60 ng/dL 1.61  Triiodothyronine,Free,Serum     2.3 - 4.2 pg/mL 3.1  Normal TFTs.  Carlus Pavlov, MD PhD [facility] Endocrin[facility]

## 2017-01-17 ENCOUNTER — Encounter: Payer: Self-pay | Admitting: Internal Medicine

## 2017-01-17 ENCOUNTER — Other Ambulatory Visit: Payer: Self-pay | Admitting: Internal Medicine

## 2017-01-17 DIAGNOSIS — E063 Autoimmune thyroiditis: Secondary | ICD-10-CM

## 2017-01-21 ENCOUNTER — Other Ambulatory Visit (INDEPENDENT_AMBULATORY_CARE_PROVIDER_SITE_OTHER): Payer: BLUE CROSS/BLUE SHIELD

## 2017-01-21 DIAGNOSIS — E063 Autoimmune thyroiditis: Secondary | ICD-10-CM

## 2017-01-21 LAB — T4, FREE: Free T4: 0.88 ng/dL (ref 0.60–1.60)

## 2017-01-21 LAB — T3, FREE: T3, Free: 3.4 pg/mL (ref 2.3–4.2)

## 2017-01-21 LAB — TSH: TSH: 4.26 u[IU]/mL (ref 0.35–4.50)

## 2017-02-05 ENCOUNTER — Other Ambulatory Visit: Payer: Self-pay | Admitting: Obstetrics and Gynecology

## 2017-02-05 ENCOUNTER — Other Ambulatory Visit (HOSPITAL_COMMUNITY)
Admission: RE | Admit: 2017-02-05 | Discharge: 2017-02-05 | Disposition: A | Discharge status: Home / Self Care | Payer: BLUE CROSS/BLUE SHIELD | Source: Ambulatory Visit | Attending: Obstetrics and Gynecology | Admitting: Obstetrics and Gynecology

## 2017-02-05 DIAGNOSIS — N852 Hypertrophy of uterus: Secondary | ICD-10-CM | POA: Diagnosis not present

## 2017-02-05 DIAGNOSIS — Z01419 Encounter for gynecological examination (general) (routine) without abnormal findings: Secondary | ICD-10-CM | POA: Diagnosis not present

## 2017-02-05 DIAGNOSIS — Z23 Encounter for immunization: Secondary | ICD-10-CM | POA: Diagnosis not present

## 2017-02-05 DIAGNOSIS — Z1231 Encounter for screening mammogram for malignant neoplasm of breast: Secondary | ICD-10-CM

## 2017-02-05 DIAGNOSIS — Z124 Encounter for screening for malignant neoplasm of cervix: Secondary | ICD-10-CM | POA: Insufficient documentation

## 2017-02-05 DIAGNOSIS — D509 Iron deficiency anemia, unspecified: Secondary | ICD-10-CM | POA: Diagnosis not present

## 2017-02-06 DIAGNOSIS — Z23 Encounter for immunization: Secondary | ICD-10-CM | POA: Diagnosis not present

## 2017-02-07 LAB — CYTOLOGY - PAP
Diagnosis: NEGATIVE
HPV: NOT DETECTED

## 2017-02-11 DIAGNOSIS — N83291 Other ovarian cyst, right side: Secondary | ICD-10-CM | POA: Diagnosis not present

## 2017-02-11 DIAGNOSIS — N920 Excessive and frequent menstruation with regular cycle: Secondary | ICD-10-CM | POA: Diagnosis not present

## 2017-02-11 DIAGNOSIS — N852 Hypertrophy of uterus: Secondary | ICD-10-CM | POA: Diagnosis not present

## 2017-02-20 DIAGNOSIS — D225 Melanocytic nevi of trunk: Secondary | ICD-10-CM | POA: Diagnosis not present

## 2017-02-20 DIAGNOSIS — D2271 Melanocytic nevi of right lower limb, including hip: Secondary | ICD-10-CM | POA: Diagnosis not present

## 2017-02-20 DIAGNOSIS — L821 Other seborrheic keratosis: Secondary | ICD-10-CM | POA: Diagnosis not present

## 2017-02-20 DIAGNOSIS — D2272 Melanocytic nevi of left lower limb, including hip: Secondary | ICD-10-CM | POA: Diagnosis not present

## 2017-03-28 DIAGNOSIS — N83291 Other ovarian cyst, right side: Secondary | ICD-10-CM | POA: Diagnosis not present

## 2017-04-11 DIAGNOSIS — D509 Iron deficiency anemia, unspecified: Secondary | ICD-10-CM | POA: Diagnosis not present

## 2017-04-11 DIAGNOSIS — Z0000 Encounter for general adult medical examination without abnormal findings: Secondary | ICD-10-CM | POA: Diagnosis not present

## 2017-05-16 ENCOUNTER — Ambulatory Visit: Payer: BLUE CROSS/BLUE SHIELD

## 2017-06-04 DIAGNOSIS — D500 Iron deficiency anemia secondary to blood loss (chronic): Secondary | ICD-10-CM | POA: Diagnosis not present

## 2017-06-04 DIAGNOSIS — N920 Excessive and frequent menstruation with regular cycle: Secondary | ICD-10-CM | POA: Diagnosis not present

## 2017-06-07 ENCOUNTER — Ambulatory Visit
Admission: RE | Admit: 2017-06-07 | Discharge: 2017-06-07 | Disposition: A | Discharge status: Home / Self Care | Payer: BLUE CROSS/BLUE SHIELD | Source: Ambulatory Visit | Attending: Obstetrics and Gynecology | Admitting: Obstetrics and Gynecology

## 2017-06-07 DIAGNOSIS — Z1231 Encounter for screening mammogram for malignant neoplasm of breast: Secondary | ICD-10-CM

## 2017-09-23 ENCOUNTER — Ambulatory Visit: Payer: BLUE CROSS/BLUE SHIELD | Admitting: Internal Medicine

## 2017-09-23 ENCOUNTER — Encounter: Payer: Self-pay | Admitting: Internal Medicine

## 2017-09-23 VITALS — BP 110/60 | HR 83 | Ht 63.75 in | Wt 133.4 lb

## 2017-09-23 DIAGNOSIS — E063 Autoimmune thyroiditis: Secondary | ICD-10-CM | POA: Diagnosis not present

## 2017-09-23 LAB — TSH: TSH: 4.41 u[IU]/mL (ref 0.35–4.50)

## 2017-09-23 LAB — T3, FREE: T3 FREE: 3.4 pg/mL (ref 2.3–4.2)

## 2017-09-23 LAB — T4, FREE: FREE T4: 0.85 ng/dL (ref 0.60–1.60)

## 2017-09-23 NOTE — Patient Instructions (Addendum)
Please stop at the lab.  If you need to start Levothyroxine, take it every, with water, at least 30 minutes before breakfast, separated by at least 4 hours from: - acid reflux medications - calcium - iron - multivitamins  Please return in 1 year.

## 2017-09-23 NOTE — Progress Notes (Signed)
Patient ID: Karen Guzman, female   DOB: 05/10/1970, 47 y.o.   MRN: [id number]   HPI  Karen HoraKristin Guzman is a 47 y.o.-year-old female, returning for f/u for Hashimoto's hypothyroidism. Last visit 1 year ago.  Reviewed and addended history: Pt. has been dx with hypothyroidism in 01/2014; not on levothyroxine yet since euthyroid.  Reviewed patient's TFTs: Lab Results  Component Value Date   TSH 4.26 01/21/2017   TSH 3.08 09/21/2016   TSH 2.81 03/12/2016   TSH 4.34 09/12/2015   TSH 1.82 03/14/2015   FREET4 0.88 01/21/2017   FREET4 0.78 09/21/2016   FREET4 0.79 03/12/2016   FREET4 0.83 09/12/2015   FREET4 0.87 03/14/2015   02/01/2014  -elevated antibodies:  TSH 4.89, TT4 7.8 TPO Abs 112 (<34) ATA 5.0 (<0.9)  She has regular, but heavy menses.  She was iron supplements.  Pt denies: - feeling nodules in neck - hoarseness - dysphagia - choking - SOB with lying down  Thyroid U/S (09/16/2015): Mild thyromegaly without focal nodule.   ROS: Constitutional: no weight gain/no weight loss, no fatigue, no subjective hyperthermia, no subjective hypothermia Eyes: no blurry vision, no xerophthalmia ENT: no sore throat, + see HPI Cardiovascular: no CP/no SOB/no palpitations/no leg swelling Respiratory: no cough/no SOB/no wheezing Gastrointestinal: no N/no V/no D/no C/no acid reflux Musculoskeletal: no muscle aches/no joint aches Skin: no rashes, no hair loss Neurological: no tremors/no numbness/no tingling/no dizziness  I reviewed pt's medications, allergies, PMH, social hx, family hx, and changes were documented in the history of present illness. Otherwise, unchanged from my initial visit note.  Past medical history: - Heavy menstrual cycles with subsequent anemia  Past surgical history: - C-section in 2007 and 2009  History   Social History  . Marital Status: Married    Spouse Name: N/A    Number of Children: 2   Occupational History  .  stay at home mom    Social  History Main Topics  . Smoking status: Never Smoker   . Smokeless tobacco: Not on file  . Alcohol Use:     3 -4 wine drinks per week  . Drug Use: No    Allergies  Allergen Reactions  . Penicillins Rash   Family history: - Negative for diabetes and hypertension - Positive for hyperlipidemia in father - Negative for heart problems - Positive for thyroid problems as mentioned in the history of present illness  PE: BP 110/60   Pulse 83   Ht 5' 3.75" (1.619 m)   Wt 133 lb 6.4 oz (60.5 kg)   LMP 09/09/2017   SpO2 98%   BMI 23.08 kg/m  Body mass index is 23.08 kg/m. Wt Readings from Last 3 Encounters:  09/23/17 133 lb 6.4 oz (60.5 kg)  09/21/16 136 lb 3.2 oz (61.8 kg)  09/12/15 129 lb 12.8 oz (58.9 kg)   Constitutional: Normal weight, in NAD Eyes: PERRLA, EOMI, no exophthalmos ENT: moist mucous membranes, + mild, symmetric thyromegaly, no cervical lymphadenopathy Cardiovascular: RRR, No MRG Respiratory: CTA B Gastrointestinal: abdomen soft, NT, ND, BS+ Musculoskeletal: no deformities, strength intact in all 4 Skin: moist, warm, no rashes Neurological: no tremor with outstretched hands, DTR normal in all 4  ASSESSMENT: 1. Hashimoto's Hypothyroidism  2. Thyromegaly  PLAN:  1. Patient with mild Hashimoto's thyroiditis, euthyroid.  She denies hypothyroid symptoms: Weight gain, constipation, fatigue, dry skin, hair loss. - Latest TFTs were reviewed from 6 mo ago and they were normal - We will recheck her TFTs today and discussed about  the possible need to start levothyroxine if these are abnormal.  I advised her how to take the levothyroxine correctly, in case we need to start: Daily, fasting, with water, separated by at least 30 minutes from breakfast and at least 4 hours from calcium, iron, multivitamins, acid reflux medications. - However, we may not need treatment if she has subclinical hypothyroidism - criteria for treatment are:  TSH >10  Signs and symptoms of  hypothyroidism  Desire for pregnancy -I will have her stop at the lab for TSH, free T4, free T3 - I will see her back in 1 year  2. Thyromegaly -She denies any neck compression symptoms  -reviewed the report of her latest thyroid ultrasound obtained 2 years ago mild thyromegaly without focal nodule  Office Visit on 09/23/2017  Component Date Value Ref Range Status  . TSH 09/23/2017 4.41  0.35 - 4.50 uIU/mL Final  . Free T4 09/23/2017 0.85  0.60 - 1.60 ng/dL Final   Comment: Specimens from patients who are undergoing biotin therapy and /or ingesting biotin supplements may contain high levels of biotin.  The higher biotin concentration in these specimens interferes with this Free T4 assay.  Specimens that contain high levels  of biotin may cause false high results for this Free T4 assay.  Please interpret results in light of the total clinical presentation of the patient.    . T3, Free 09/23/2017 3.4  2.3 - 4.2 pg/mL Final   Thyroid test normal, she does not require levothyroxine yet.  Carlus Pavlov, MD PhD [facility] Endocrinology

## 2017-12-03 DIAGNOSIS — R509 Fever, unspecified: Secondary | ICD-10-CM | POA: Diagnosis not present

## 2017-12-03 DIAGNOSIS — D500 Iron deficiency anemia secondary to blood loss (chronic): Secondary | ICD-10-CM | POA: Diagnosis not present

## 2017-12-03 DIAGNOSIS — R5383 Other fatigue: Secondary | ICD-10-CM | POA: Diagnosis not present

## 2017-12-03 DIAGNOSIS — E063 Autoimmune thyroiditis: Secondary | ICD-10-CM | POA: Diagnosis not present

## 2018-01-21 DIAGNOSIS — D225 Melanocytic nevi of trunk: Secondary | ICD-10-CM | POA: Diagnosis not present

## 2018-01-21 DIAGNOSIS — D2261 Melanocytic nevi of right upper limb, including shoulder: Secondary | ICD-10-CM | POA: Diagnosis not present

## 2018-01-21 DIAGNOSIS — D2262 Melanocytic nevi of left upper limb, including shoulder: Secondary | ICD-10-CM | POA: Diagnosis not present

## 2018-01-21 DIAGNOSIS — D2272 Melanocytic nevi of left lower limb, including hip: Secondary | ICD-10-CM | POA: Diagnosis not present

## 2018-02-05 DIAGNOSIS — Z23 Encounter for immunization: Secondary | ICD-10-CM | POA: Diagnosis not present

## 2018-02-25 DIAGNOSIS — N898 Other specified noninflammatory disorders of vagina: Secondary | ICD-10-CM | POA: Diagnosis not present

## 2018-02-25 DIAGNOSIS — N920 Excessive and frequent menstruation with regular cycle: Secondary | ICD-10-CM | POA: Diagnosis not present

## 2018-02-25 DIAGNOSIS — R3915 Urgency of urination: Secondary | ICD-10-CM | POA: Diagnosis not present

## 2018-02-25 DIAGNOSIS — Z01411 Encounter for gynecological examination (general) (routine) with abnormal findings: Secondary | ICD-10-CM | POA: Diagnosis not present

## 2018-02-27 ENCOUNTER — Other Ambulatory Visit: Payer: Self-pay | Admitting: Obstetrics and Gynecology

## 2018-02-27 DIAGNOSIS — N6320 Unspecified lump in the left breast, unspecified quadrant: Secondary | ICD-10-CM

## 2018-03-05 ENCOUNTER — Ambulatory Visit
Admission: RE | Admit: 2018-03-05 | Discharge: 2018-03-05 | Disposition: A | Discharge status: Home / Self Care | Payer: BLUE CROSS/BLUE SHIELD | Source: Ambulatory Visit | Attending: Obstetrics and Gynecology | Admitting: Obstetrics and Gynecology

## 2018-03-05 DIAGNOSIS — R922 Inconclusive mammogram: Secondary | ICD-10-CM | POA: Diagnosis not present

## 2018-03-05 DIAGNOSIS — N6320 Unspecified lump in the left breast, unspecified quadrant: Secondary | ICD-10-CM

## 2018-03-05 DIAGNOSIS — N6012 Diffuse cystic mastopathy of left breast: Secondary | ICD-10-CM | POA: Diagnosis not present

## 2018-04-07 DIAGNOSIS — Z30430 Encounter for insertion of intrauterine contraceptive device: Secondary | ICD-10-CM | POA: Diagnosis not present

## 2018-04-07 DIAGNOSIS — Z3202 Encounter for pregnancy test, result negative: Secondary | ICD-10-CM | POA: Diagnosis not present

## 2018-05-01 DIAGNOSIS — Z0000 Encounter for general adult medical examination without abnormal findings: Secondary | ICD-10-CM | POA: Diagnosis not present

## 2018-05-14 DIAGNOSIS — Z0000 Encounter for general adult medical examination without abnormal findings: Secondary | ICD-10-CM | POA: Diagnosis not present

## 2018-05-14 DIAGNOSIS — E7800 Pure hypercholesterolemia, unspecified: Secondary | ICD-10-CM | POA: Diagnosis not present

## 2018-05-19 DIAGNOSIS — Z30431 Encounter for routine checking of intrauterine contraceptive device: Secondary | ICD-10-CM | POA: Diagnosis not present

## 2018-09-16 ENCOUNTER — Other Ambulatory Visit: Payer: Self-pay

## 2018-09-17 ENCOUNTER — Ambulatory Visit: Payer: BLUE CROSS/BLUE SHIELD | Admitting: Internal Medicine

## 2018-09-17 ENCOUNTER — Other Ambulatory Visit: Payer: Self-pay

## 2018-09-17 ENCOUNTER — Encounter: Payer: Self-pay | Admitting: Internal Medicine

## 2018-09-17 VITALS — BP 110/70 | HR 92 | Ht 63.75 in | Wt 140.0 lb

## 2018-09-17 DIAGNOSIS — E063 Autoimmune thyroiditis: Secondary | ICD-10-CM | POA: Diagnosis not present

## 2018-09-17 LAB — TSH: TSH: 3.62 u[IU]/mL (ref 0.35–4.50)

## 2018-09-17 LAB — T3, FREE: T3, Free: 3.1 pg/mL (ref 2.3–4.2)

## 2018-09-17 LAB — T4, FREE: Free T4: 0.76 ng/dL (ref 0.60–1.60)

## 2018-09-17 NOTE — Progress Notes (Signed)
Patient ID: [name], female   DOB: 08-24-1970, 48 y.o.   MRN: [id number]   HPI  Karen Guzman is a 48 y.o.-year-old female, returning for f/u for Hashimoto's hypothyroidism. Last visit 1 year ago.  Reviewed and addended history: Pt. has been dx with hypothyroidism in 01/2014; we did not start levothyroxine yet since she is euthyroid.  Reviewed patient's TFTs: Lab Results  Component Value Date   TSH 4.41 09/23/2017   TSH 4.26 01/21/2017   TSH 3.08 09/21/2016   TSH 2.81 03/12/2016   TSH 4.34 09/12/2015   FREET4 0.85 09/23/2017   FREET4 0.88 01/21/2017   FREET4 0.78 09/21/2016   FREET4 0.79 03/12/2016   FREET4 0.83 09/12/2015   02/01/2014  -elevated antibodies:  TSH 4.89, TT4 7.8 TPO Abs 112 (<34) ATA 5.0 (<0.9)  She has regular, but heavy menses.  She was on iron supplements. Now on Mirena since 03/2018 >> no menses now.  Pt denies: - feeling nodules in neck - hoarseness - dysphagia - choking - SOB with lying down  Thyroid U/S (09/16/2015):  Mild thyromegaly without focal nodule  ROS: Constitutional: no weight gain/no weight loss, no fatigue, no subjective hyperthermia, no subjective hypothermia Eyes: no blurry vision, no xerophthalmia ENT: no sore throat, + see HPI Cardiovascular: no CP/no SOB/no palpitations/no leg swelling Respiratory: no cough/no SOB/no wheezing Gastrointestinal: no N/no V/no D/no C/no acid reflux Musculoskeletal: no muscle aches/no joint aches Skin: no rashes, no hair loss Neurological: no tremors/no numbness/no tingling/no dizziness  I reviewed pt's medications, allergies, PMH, social hx, family hx, and changes were documented in the history of present illness. Otherwise, unchanged from my initial visit note.  Past medical history: - Heavy menstrual cycles with subsequent anemia  Past surgical history: - C-section in 2007 and 2009  History   Social History  . Marital Status: Married    Spouse Name: N/A    Number of  Children: 2   Occupational History  .  stay at home mom    Social History Main Topics  . Smoking status: Never Smoker   . Smokeless tobacco: Not on file  . Alcohol Use:     3 -4 wine drinks per week  . Drug Use: No    Allergies  Allergen Reactions  . Penicillins Rash   Family history: - Negative for diabetes and hypertension - Positive for hyperlipidemia in father - Negative for heart problems - Positive for thyroid problems as mentioned in the history of present illness  PE: BP 110/70   Pulse 92   Ht 5' 3.75" (1.619 m)   Wt 140 lb (63.5 kg)   SpO2 98%   BMI 24.22 kg/m  Body mass index is 24.22 kg/m. Wt Readings from Last 3 Encounters:  09/17/18 140 lb (63.5 kg)  09/23/17 133 lb 6.4 oz (60.5 kg)  09/21/16 136 lb 3.2 oz (61.8 kg)   Constitutional: Normal weight, in NAD Eyes: PERRLA, EOMI, no exophthalmos ENT: moist mucous membranes, + mild, symmetric, thyromegaly, no cervical lymphadenopathy Cardiovascular: RRR, No MRG Respiratory: CTA B Gastrointestinal: abdomen soft, NT, ND, BS+ Musculoskeletal: no deformities, strength intact in all 4 Skin: moist, warm, no rashes Neurological: no tremor with outstretched hands, DTR normal in all 4  ASSESSMENT: 1. Hashimoto's Hypothyroidism  2. Thyromegaly  PLAN:  1. Patient with mild Hashimoto's thyroiditis, euthyroid. -She denies hypothyroid symptoms: Weight gain, constipation, fatigue, dry skin, hair loss -Latest TFTs were reviewed from 09/2017 and they were normal -At this visit, we will recheck  her TFTs and discussed about the possible need to start levothyroxine if these are abnormal.  In case we need to start, we discussed that she needs to take the medication daily, fasting, with water, separated by at least 30 minutes from breakfast and at least 4 hours from calcium, iron, multivitamins, and acid reflux medications. -We again discussed that we may not need treatment if she has subclinical hypothyroidism and criteria  for treatment are:  TSH >10  Signs and symptoms of hypothyroidism  Desire for pregnancy -Today we will check her TSH, free T4, free T3 -I will see her back in 1 year, but possibly sooner for labs  2. Thyromegaly -She denies any neck compression symptoms -Reviewed the report of her latest thyroid ultrasound obtained 3 years ago: Mild thyromegaly without focal nodule  - time spent with the patient: 15 min, of which >50% was spent in obtaining information about her symptoms, reviewing her previous labs, evaluations, and treatments, counseling her about her conditions (please see the discussed topics above), and developing a plan to further investigate and treat it.   Office Visit on 09/17/2018  Component Date Value Ref Range Status  . TSH 09/17/2018 3.62  0.35 - 4.50 uIU/mL Final  . Free T4 09/17/2018 0.76  0.60 - 1.60 ng/dL Final   Comment: Specimens from patients who are undergoing biotin therapy and /or ingesting biotin supplements may contain high levels of biotin.  The higher biotin concentration in these specimens interferes with this Free T4 assay.  Specimens that contain high levels  of biotin may cause false high results for this Free T4 assay.  Please interpret results in light of the total clinical presentation of the patient.    . T3, Free 09/17/2018 3.1  2.3 - 4.2 pg/mL Final   Normal TFTs. TSH improved. No need to start Levothyroxine.  Karen Pavlovristina Adelee Hannula, MD PhD [facility] Endocrin[facility]

## 2018-09-17 NOTE — Patient Instructions (Signed)
Please stop at the lab.  Please come back for a follow-up appointment in 1 year.  

## 2018-09-25 ENCOUNTER — Ambulatory Visit: Payer: BLUE CROSS/BLUE SHIELD | Admitting: Internal Medicine

## 2019-02-10 DIAGNOSIS — Z23 Encounter for immunization: Secondary | ICD-10-CM | POA: Diagnosis not present

## 2019-02-24 ENCOUNTER — Other Ambulatory Visit: Payer: Self-pay

## 2019-02-24 DIAGNOSIS — Z20822 Contact with and (suspected) exposure to covid-19: Secondary | ICD-10-CM

## 2019-02-24 DIAGNOSIS — J40 Bronchitis, not specified as acute or chronic: Secondary | ICD-10-CM | POA: Diagnosis not present

## 2019-02-25 LAB — NOVEL CORONAVIRUS, NAA: SARS-CoV-2, NAA: DETECTED — AB

## 2019-02-26 ENCOUNTER — Ambulatory Visit: Payer: Self-pay

## 2019-02-26 NOTE — Telephone Encounter (Signed)
Provided covid lab results to patient.  Provided care advice to patient.  Voiced understanding.

## 2019-04-28 ENCOUNTER — Other Ambulatory Visit: Payer: Self-pay | Admitting: Obstetrics and Gynecology

## 2019-04-28 DIAGNOSIS — Z01419 Encounter for gynecological examination (general) (routine) without abnormal findings: Secondary | ICD-10-CM | POA: Diagnosis not present

## 2019-04-28 DIAGNOSIS — Z1231 Encounter for screening mammogram for malignant neoplasm of breast: Secondary | ICD-10-CM

## 2019-05-05 DIAGNOSIS — Z0000 Encounter for general adult medical examination without abnormal findings: Secondary | ICD-10-CM | POA: Diagnosis not present

## 2019-05-14 DIAGNOSIS — H52222 Regular astigmatism, left eye: Secondary | ICD-10-CM | POA: Diagnosis not present

## 2019-05-14 DIAGNOSIS — H524 Presbyopia: Secondary | ICD-10-CM | POA: Diagnosis not present

## 2019-05-14 DIAGNOSIS — H5213 Myopia, bilateral: Secondary | ICD-10-CM | POA: Diagnosis not present

## 2019-05-20 DIAGNOSIS — Z23 Encounter for immunization: Secondary | ICD-10-CM | POA: Diagnosis not present

## 2019-05-20 DIAGNOSIS — D509 Iron deficiency anemia, unspecified: Secondary | ICD-10-CM | POA: Diagnosis not present

## 2019-05-20 DIAGNOSIS — E7889 Other lipoprotein metabolism disorders: Secondary | ICD-10-CM | POA: Diagnosis not present

## 2019-05-20 DIAGNOSIS — Z0000 Encounter for general adult medical examination without abnormal findings: Secondary | ICD-10-CM | POA: Diagnosis not present

## 2019-06-09 ENCOUNTER — Ambulatory Visit
Admission: RE | Admit: 2019-06-09 | Discharge: 2019-06-09 | Disposition: A | Discharge status: Home / Self Care | Payer: BC Managed Care – PPO | Source: Ambulatory Visit | Attending: Obstetrics and Gynecology | Admitting: Obstetrics and Gynecology

## 2019-06-09 ENCOUNTER — Other Ambulatory Visit: Payer: Self-pay

## 2019-06-09 DIAGNOSIS — Z1231 Encounter for screening mammogram for malignant neoplasm of breast: Secondary | ICD-10-CM | POA: Diagnosis not present

## 2019-06-15 DIAGNOSIS — D509 Iron deficiency anemia, unspecified: Secondary | ICD-10-CM | POA: Diagnosis not present

## 2019-07-10 DIAGNOSIS — Z1159 Encounter for screening for other viral diseases: Secondary | ICD-10-CM | POA: Diagnosis not present

## 2019-07-15 DIAGNOSIS — K635 Polyp of colon: Secondary | ICD-10-CM | POA: Diagnosis not present

## 2019-07-15 DIAGNOSIS — D509 Iron deficiency anemia, unspecified: Secondary | ICD-10-CM | POA: Diagnosis not present

## 2019-07-15 DIAGNOSIS — K2940 Chronic atrophic gastritis without bleeding: Secondary | ICD-10-CM | POA: Diagnosis not present

## 2019-07-15 DIAGNOSIS — K3189 Other diseases of stomach and duodenum: Secondary | ICD-10-CM | POA: Diagnosis not present

## 2019-07-15 DIAGNOSIS — K2930 Chronic superficial gastritis without bleeding: Secondary | ICD-10-CM | POA: Diagnosis not present

## 2019-07-15 DIAGNOSIS — K6289 Other specified diseases of anus and rectum: Secondary | ICD-10-CM | POA: Diagnosis not present

## 2019-07-15 DIAGNOSIS — K317 Polyp of stomach and duodenum: Secondary | ICD-10-CM | POA: Diagnosis not present

## 2019-07-30 DIAGNOSIS — D509 Iron deficiency anemia, unspecified: Secondary | ICD-10-CM | POA: Diagnosis not present

## 2019-08-06 DIAGNOSIS — E538 Deficiency of other specified B group vitamins: Secondary | ICD-10-CM | POA: Diagnosis not present

## 2019-08-13 DIAGNOSIS — E538 Deficiency of other specified B group vitamins: Secondary | ICD-10-CM | POA: Diagnosis not present

## 2019-08-20 DIAGNOSIS — E538 Deficiency of other specified B group vitamins: Secondary | ICD-10-CM | POA: Diagnosis not present

## 2019-09-01 DIAGNOSIS — E538 Deficiency of other specified B group vitamins: Secondary | ICD-10-CM | POA: Diagnosis not present

## 2019-09-08 DIAGNOSIS — D485 Neoplasm of uncertain behavior of skin: Secondary | ICD-10-CM | POA: Diagnosis not present

## 2019-09-08 DIAGNOSIS — Z1283 Encounter for screening for malignant neoplasm of skin: Secondary | ICD-10-CM | POA: Diagnosis not present

## 2019-09-08 DIAGNOSIS — L570 Actinic keratosis: Secondary | ICD-10-CM | POA: Diagnosis not present

## 2019-09-08 DIAGNOSIS — D2261 Melanocytic nevi of right upper limb, including shoulder: Secondary | ICD-10-CM | POA: Diagnosis not present

## 2019-09-08 DIAGNOSIS — X32XXXA Exposure to sunlight, initial encounter: Secondary | ICD-10-CM | POA: Diagnosis not present

## 2019-09-08 DIAGNOSIS — D225 Melanocytic nevi of trunk: Secondary | ICD-10-CM | POA: Diagnosis not present

## 2019-09-14 DIAGNOSIS — W57XXXA Bitten or stung by nonvenomous insect and other nonvenomous arthropods, initial encounter: Secondary | ICD-10-CM | POA: Diagnosis not present

## 2019-09-14 DIAGNOSIS — E538 Deficiency of other specified B group vitamins: Secondary | ICD-10-CM | POA: Diagnosis not present

## 2019-09-14 DIAGNOSIS — R21 Rash and other nonspecific skin eruption: Secondary | ICD-10-CM | POA: Diagnosis not present

## 2019-09-17 ENCOUNTER — Ambulatory Visit: Payer: BLUE CROSS/BLUE SHIELD | Admitting: Internal Medicine

## 2019-09-18 DIAGNOSIS — K2940 Chronic atrophic gastritis without bleeding: Secondary | ICD-10-CM | POA: Diagnosis not present

## 2019-09-18 DIAGNOSIS — D510 Vitamin B12 deficiency anemia due to intrinsic factor deficiency: Secondary | ICD-10-CM | POA: Diagnosis not present

## 2019-09-22 DIAGNOSIS — E538 Deficiency of other specified B group vitamins: Secondary | ICD-10-CM | POA: Diagnosis not present

## 2019-10-02 DIAGNOSIS — D510 Vitamin B12 deficiency anemia due to intrinsic factor deficiency: Secondary | ICD-10-CM | POA: Diagnosis not present

## 2019-11-04 ENCOUNTER — Telehealth: Payer: Self-pay

## 2019-11-04 ENCOUNTER — Other Ambulatory Visit (INDEPENDENT_AMBULATORY_CARE_PROVIDER_SITE_OTHER): Payer: BC Managed Care – PPO

## 2019-11-04 ENCOUNTER — Other Ambulatory Visit: Payer: Self-pay

## 2019-11-04 DIAGNOSIS — E063 Autoimmune thyroiditis: Secondary | ICD-10-CM

## 2019-11-04 DIAGNOSIS — D509 Iron deficiency anemia, unspecified: Secondary | ICD-10-CM | POA: Diagnosis not present

## 2019-11-04 LAB — T3, FREE: T3, Free: 2.8 pg/mL (ref 2.3–4.2)

## 2019-11-04 LAB — T4, FREE: Free T4: 0.84 ng/dL (ref 0.60–1.60)

## 2019-11-04 LAB — TSH: TSH: 3.24 u[IU]/mL (ref 0.35–4.50)

## 2019-11-04 NOTE — Telephone Encounter (Signed)
Patient called to discuss the MyChart messages she has been exchanging with [place] regarding lab work. I have reviewed her chart and do not see any messages from [place].  Patient states she is not feeling well and wants to have thyroid labs now rather than wait until her appointment in Sept.   I let her know Dr. Elvera Lennox does not usually order labs until the patient comes in for the appointment, patient then states " That is not what Dr. Elvera Lennox told me, she said to just call when I feel bad and she will order labs.)  Please advise.

## 2019-11-04 NOTE — Telephone Encounter (Signed)
Yes, that is correct - we can check thyroid labs now.  Let us order a TSH, free T4, free T3.

## 2019-11-04 NOTE — Telephone Encounter (Signed)
Labs ordered and patient notified.  Lab appt today for 2 PM.

## 2019-12-04 DIAGNOSIS — D510 Vitamin B12 deficiency anemia due to intrinsic factor deficiency: Secondary | ICD-10-CM | POA: Diagnosis not present

## 2019-12-04 DIAGNOSIS — E538 Deficiency of other specified B group vitamins: Secondary | ICD-10-CM | POA: Diagnosis not present

## 2020-01-01 ENCOUNTER — Ambulatory Visit: Payer: BC Managed Care – PPO | Admitting: Internal Medicine

## 2020-01-01 NOTE — Progress Notes (Deleted)
Patient ID: Karen Guzman, female   DOB: 03/04/1971, 49 y.o.   MRN: [id number]  This visit occurred during the SARS-CoV-2 public health emergency.  Safety protocols were in place, including screening questions prior to the visit, additional usage of staff PPE, and extensive cleaning of exam room while observing appropriate contact time as indicated for disinfecting solutions.   HPI  Karen Guzman is a 49 y.o.-year-old female, returning for f/u for Hashimoto's hypothyroidism. Last visit 1 year and 3.5 months ago.  Since last visit, she developed COVID-19 infection in 02/2019.  Reviewed history: Pt. has been dx with hypothyroidism in 01/2014; we did not start levothyroxine yet since she is euthyroid.  Reviewed her TFTs: Lab Results  Component Value Date   TSH 3.24 11/04/2019   TSH 3.62 09/17/2018   TSH 4.41 09/23/2017   TSH 4.26 01/21/2017   TSH 3.08 09/21/2016   FREET4 0.84 11/04/2019   FREET4 0.76 09/17/2018   FREET4 0.85 09/23/2017   FREET4 0.88 01/21/2017   FREET4 0.78 09/21/2016   02/01/2014  -elevated antibodies:  TSH 4.89, TT4 7.8 TPO Abs 112 (<34) ATA 5.0 (<0.9)  She had regular but heavy menses.  Previously on iron supplements. Now on Mirena since 03/2018 >> no menses.  Pt denies: - feeling nodules in neck - hoarseness - dysphagia - choking - SOB with lying down  Thyroid U/S (09/16/2015): Mild thyromegaly without focal nodule  ROS: Constitutional: no weight gain/no weight loss, no fatigue, no subjective hyperthermia, no subjective hypothermia Eyes: no blurry vision, no xerophthalmia ENT: no sore throat, + see HPI Cardiovascular: no CP/no SOB/no palpitations/no leg swelling Respiratory: no cough/no SOB/no wheezing Gastrointestinal: no N/no V/no D/no C/no acid reflux Musculoskeletal: no muscle aches/no joint aches Skin: no rashes, no hair loss Neurological: no tremors/no numbness/no tingling/no dizziness  I reviewed pt's medications, allergies, PMH,  social hx, family hx, and changes were documented in the history of present illness. Otherwise, unchanged from my initial visit note.  Past medical history: - Heavy menstrual cycles with subsequent anemia  Past surgical history: - C-section in 2007 and 2009  History   Social History  . Marital Status: Married    Spouse Name: N/A    Number of Children: 2   Occupational History  .  stay at home mom    Social History Main Topics  . Smoking status: Never Smoker   . Smokeless tobacco: Not on file  . Alcohol Use:     3 -4 wine drinks per week  . Drug Use: No    Allergies  Allergen Reactions  . Penicillins Rash   Family history: - Negative for diabetes and hypertension - Positive for hyperlipidemia in father - Negative for heart problems - Positive for thyroid problems as mentioned in the history of present illness  PE: There were no vitals taken for this visit. There is no height or weight on file to calculate BMI. Wt Readings from Last 3 Encounters:  09/17/18 140 lb (63.5 kg)  09/23/17 133 lb 6.4 oz (60.5 kg)  09/21/16 136 lb 3.2 oz (61.8 kg)   Constitutional: normal weight, in NAD Eyes: PERRLA, EOMI, no exophthalmos ENT: moist mucous membranes, + mild symmetric thyromegaly, no cervical lymphadenopathy Cardiovascular: RRR, No MRG Respiratory: CTA B Gastrointestinal: abdomen soft, NT, ND, BS+ Musculoskeletal: no deformities, strength intact in all 4 Skin: moist, warm, no rashes Neurological: no tremor with outstretched hands, DTR normal in all 4  ASSESSMENT: 1. Hashimoto's Hypothyroidism  2. Thyromegaly  PLAN:  1. Patient with mild Hashimoto's thyroiditis, euthyroid -At last visit she denied hypothyroid symptoms: Weight gain, constipation, fatigue, dry skin, hair loss.  However, she called [place] that she was not feeling well and we repeated TFTs 2 months ago. -Reviewed her labs from 10/2019 and these were normal -At this visit, we will recheck her TFTs and  discussed about the possible need to start levothyroxine if these are abnormal.  We discussed about how to take levothyroxine in case she needs to start in the future: every day, with water, at least 30 minutes before breakfast, separated by at least 4 hours from: - acid reflux medications - calcium - iron - multivitamins -We again discussed about criteria for treatment for subclinical hypothyroidism:  TSH higher than 10  Desire for pregnancy  Signs and symptoms consistent with hypothyroidism -As of now, she does not appear to need levothyroxine treatment -I will see her back in 1 year, but possibly sooner for labs  2. Thyromegaly -On palpation -She denies neck compression symptoms -Reviewed the report of the latest thyroid ultrasound from 2017: Mild thyromegaly without focal nodule  Carlus Pavlov, MD PhD [facility]

## 2020-01-04 ENCOUNTER — Ambulatory Visit (INDEPENDENT_AMBULATORY_CARE_PROVIDER_SITE_OTHER): Payer: BC Managed Care – PPO | Admitting: Internal Medicine

## 2020-01-04 ENCOUNTER — Encounter: Payer: Self-pay | Admitting: Internal Medicine

## 2020-01-04 ENCOUNTER — Other Ambulatory Visit: Payer: Self-pay

## 2020-01-04 VITALS — BP 112/70 | HR 96 | Ht 63.75 in | Wt 125.0 lb

## 2020-01-04 DIAGNOSIS — L659 Nonscarring hair loss, unspecified: Secondary | ICD-10-CM

## 2020-01-04 DIAGNOSIS — E063 Autoimmune thyroiditis: Secondary | ICD-10-CM | POA: Diagnosis not present

## 2020-01-04 NOTE — Progress Notes (Signed)
Patient ID: Karen Guzman, female   DOB: June 15, 1970, 49 y.o.   MRN: [id number]  This visit occurred during the SARS-CoV-2 public health emergency.  Safety protocols were in place, including screening questions prior to the visit, additional usage of staff PPE, and extensive cleaning of exam room while observing appropriate contact time as indicated for disinfecting solutions.   HPI  Karen Guzman is a 49 y.o.-year-old female, returning for f/u for Hashimoto's hypothyroidism.  Last visit was 1 year and 3.5 months ago.  Since last visit, she had COVID-19 infection in 02/2019.  Reviewed history: Pt. has been dx with hypothyroidism in 01/2014; we did not start levothyroxine yet since she is euthyroid.  Reviewed her TFTs: Lab Results  Component Value Date   TSH 3.24 11/04/2019   TSH 3.62 09/17/2018   TSH 4.41 09/23/2017   TSH 4.26 01/21/2017   TSH 3.08 09/21/2016   FREET4 0.84 11/04/2019   FREET4 0.76 09/17/2018   FREET4 0.85 09/23/2017   FREET4 0.88 01/21/2017   FREET4 0.78 09/21/2016   02/01/2014  -elevated antibiotics:  TSH 4.89, TT4 7.8 TPO Abs 112 (<34) ATA 5.0 (<0.9)  Pt denies: - feeling nodules in neck - hoarseness - dysphagia - choking - SOB with lying down  Thyroid U/S (09/16/2015): Mild thyromegaly without focal nodule  She had regular but heavy menses.  Previously on iron supplements. Now on Mirena since 03/2018 >> no menses. Now on B12.  She complains of hair loss.  This started ~08/2019 and is generalized.  She feels that lately it has been improving some.  She bought biotin but did not start yet.  ROS: Constitutional: no weight gain/+ weight loss - Optivia , no fatigue, no subjective hyperthermia, no subjective hypothermia Eyes: no blurry vision, no xerophthalmia ENT: no sore throat, + see HPI Cardiovascular: no CP/no SOB/no palpitations/no leg swelling Respiratory: no cough/no SOB/no wheezing Gastrointestinal: no N/no V/no D/no C/no acid  reflux Musculoskeletal: no muscle aches/no joint aches Skin: no rashes, + hair loss Neurological: no tremors/no numbness/no tingling/no dizziness  I reviewed pt's medications, allergies, PMH, social hx, family hx, and changes were documented in the history of present illness. Otherwise, unchanged from my initial visit note.  Past medical history: - Heavy menstrual cycles with subsequent anemia  Past surgical history: - C-section in 2007 and 2009  History   Social History  . Marital Status: Married    Spouse Name: N/A    Number of Children: 2   Occupational History  .  stay at home mom    Social History Main Topics  . Smoking status: Never Smoker   . Smokeless tobacco: Not on file  . Alcohol Use:     3 -4 wine drinks per week  . Drug Use: No    Allergies  Allergen Reactions  . Penicillins Rash   Family history: - Negative for diabetes and hypertension - Positive for hyperlipidemia in father - Negative for heart problems - Positive for thyroid problems as mentioned in the history of present illness  PE: BP 112/70   Pulse 96   Ht 5' 3.75" (1.619 m)   Wt 125 lb (56.7 kg)   SpO2 98%   BMI 21.62 kg/m  Body mass index is 21.62 kg/m. Wt Readings from Last 3 Encounters:  01/04/20 125 lb (56.7 kg)  09/17/18 140 lb (63.5 kg)  09/23/17 133 lb 6.4 oz (60.5 kg)   Constitutional: normal weight, in NAD Eyes: PERRLA, EOMI, no exophthalmos ENT: moist mucous membranes, +  mild symmetric thyromegaly, no cervical lymphadenopathy Cardiovascular: RRR, No MRG Respiratory: CTA B Gastrointestinal: abdomen soft, NT, ND, BS+ Musculoskeletal: no deformities, strength intact in all 4 Skin: moist, warm, no rashes Neurological: no tremor with outstretched hands, DTR normal in all 4  ASSESSMENT: 1. Hashimoto's Hypothyroidism  2. Thyromegaly  3.  Hair loss  PLAN:  1. Patient with mild Hashimoto's thyroiditis, euthyroid -At last visit she denied hypothyroid symptoms: Weight  gain, constipation, fatigue, dry skin, hair loss.  However, she called [place] that she was not feeling well and we repeated TFTs 2 months ago -Reviewed her labs from 10/2019 and these were normal -We will repeat her TFTs in 3-4 more months -At this visit, we will recheck her TFTs and discussed about the possible need to start levothyroxine.  If we do end up starting this, she needs to take it: - in am - fasting - at least 30 min from b'fast - At least 4 hours from Ca, Fe, MVI, PPIs -We again discussed above criteria for treatment for subclinical hypothyroidism:  TSH higher than 10  Desire for pregnancy  Signs and symptoms consistent with hypothyroidism -As of now, she does not appear to meet levothyroxine treatment.  She is happy to continue without treatment. -I will see her back in 1 year, but sooner for labs  2. Thyromegaly -Detected on palpation -She denies neck compression symptoms -Reviewed the report of the latest thyroid ultrasound from 2017: Mild thyromegaly without focal nodule  3.  Hair loss -Generalized -Slightly improving -We discussed that this can be related to hypothyroidism, however, her TFTs were normal in 10/2019.  Also, it can be related to the high antithyroid antibodies.  We discussed that she may start selenium 200 mcg daily.  Also, biotin, but I advised her to remember to stop the biotin for approximately a week before her next set of TFTs due to interference with the assays.  Orders Placed This Encounter  Procedures  . TSH  . T4, free  . T3, free   Carlus Pavlov, MD PhD [facility] End[facility]

## 2020-01-04 NOTE — Patient Instructions (Addendum)
Please come back for labs in 3-4 more months.  You can try Selenium 200 mcg daily.  If you start Biotin, please stay off the supplement for ~1 week.  Please return for another visit in 1 year.

## 2020-01-05 DIAGNOSIS — D509 Iron deficiency anemia, unspecified: Secondary | ICD-10-CM | POA: Diagnosis not present

## 2020-02-08 DIAGNOSIS — D510 Vitamin B12 deficiency anemia due to intrinsic factor deficiency: Secondary | ICD-10-CM | POA: Diagnosis not present

## 2020-03-11 DIAGNOSIS — D509 Iron deficiency anemia, unspecified: Secondary | ICD-10-CM | POA: Diagnosis not present

## 2020-04-18 DIAGNOSIS — E538 Deficiency of other specified B group vitamins: Secondary | ICD-10-CM | POA: Diagnosis not present

## 2020-04-26 ENCOUNTER — Other Ambulatory Visit: Payer: Self-pay | Admitting: Obstetrics and Gynecology

## 2020-04-26 DIAGNOSIS — Z1231 Encounter for screening mammogram for malignant neoplasm of breast: Secondary | ICD-10-CM

## 2020-04-29 DIAGNOSIS — Z01419 Encounter for gynecological examination (general) (routine) without abnormal findings: Secondary | ICD-10-CM | POA: Diagnosis not present

## 2020-04-29 DIAGNOSIS — N3946 Mixed incontinence: Secondary | ICD-10-CM | POA: Diagnosis not present

## 2020-04-29 DIAGNOSIS — N898 Other specified noninflammatory disorders of vagina: Secondary | ICD-10-CM | POA: Diagnosis not present

## 2020-05-04 ENCOUNTER — Encounter: Payer: Self-pay | Admitting: Internal Medicine

## 2020-05-04 ENCOUNTER — Other Ambulatory Visit: Payer: Self-pay

## 2020-05-04 ENCOUNTER — Other Ambulatory Visit (INDEPENDENT_AMBULATORY_CARE_PROVIDER_SITE_OTHER): Payer: BC Managed Care – PPO

## 2020-05-04 DIAGNOSIS — E063 Autoimmune thyroiditis: Secondary | ICD-10-CM

## 2020-05-04 LAB — T3, FREE: T3, Free: 3.2 pg/mL (ref 2.3–4.2)

## 2020-05-04 LAB — T4, FREE: Free T4: 0.76 ng/dL (ref 0.60–1.60)

## 2020-05-04 LAB — TSH: TSH: 3.36 u[IU]/mL (ref 0.35–4.50)

## 2020-05-06 DIAGNOSIS — E538 Deficiency of other specified B group vitamins: Secondary | ICD-10-CM | POA: Diagnosis not present

## 2020-05-06 DIAGNOSIS — K2940 Chronic atrophic gastritis without bleeding: Secondary | ICD-10-CM | POA: Diagnosis not present

## 2020-05-06 DIAGNOSIS — K31A12 Gastric intestinal metaplasia without dysplasia, involving the body (corpus): Secondary | ICD-10-CM | POA: Diagnosis not present

## 2020-05-06 DIAGNOSIS — E164 Increased secretion of gastrin: Secondary | ICD-10-CM | POA: Diagnosis not present

## 2020-05-19 DIAGNOSIS — D509 Iron deficiency anemia, unspecified: Secondary | ICD-10-CM | POA: Diagnosis not present

## 2020-05-19 DIAGNOSIS — K2940 Chronic atrophic gastritis without bleeding: Secondary | ICD-10-CM | POA: Diagnosis not present

## 2020-05-24 ENCOUNTER — Ambulatory Visit: Payer: BC Managed Care – PPO | Admitting: Obstetrics and Gynecology

## 2020-05-25 DIAGNOSIS — Z20822 Contact with and (suspected) exposure to covid-19: Secondary | ICD-10-CM | POA: Diagnosis not present

## 2020-06-10 ENCOUNTER — Ambulatory Visit
Admission: RE | Admit: 2020-06-10 | Discharge: 2020-06-10 | Disposition: A | Discharge status: Home / Self Care | Payer: BC Managed Care – PPO | Source: Ambulatory Visit | Attending: Obstetrics and Gynecology | Admitting: Obstetrics and Gynecology

## 2020-06-10 ENCOUNTER — Other Ambulatory Visit: Payer: Self-pay

## 2020-06-10 DIAGNOSIS — Z1231 Encounter for screening mammogram for malignant neoplasm of breast: Secondary | ICD-10-CM

## 2020-06-20 ENCOUNTER — Telehealth: Payer: Self-pay

## 2020-06-20 DIAGNOSIS — D509 Iron deficiency anemia, unspecified: Secondary | ICD-10-CM | POA: Diagnosis not present

## 2020-06-20 DIAGNOSIS — K2940 Chronic atrophic gastritis without bleeding: Secondary | ICD-10-CM | POA: Diagnosis not present

## 2020-06-20 NOTE — Telephone Encounter (Signed)
Attempt made to contact Karen Guzman is a 50 y.o. female  re: New pt Pre appt call to collect history information. -Allergy -Medication -Confirm pharmacy -OB history   Pt was not available. LM on the VM for the patient to call back

## 2020-06-21 NOTE — Telephone Encounter (Signed)
Karen Guzman is a 50 y.o. female was called and contacted re: New pt Pre appt call to collect history information. -Allergy -Medication -Confirm pharmacy -OB history   Pt was available.Chart was updated. Pt was notified to arrive 15 min early and we will need a urine sample when she arrives. Pt verbalized understanding.

## 2020-06-21 NOTE — Progress Notes (Signed)
[facility] New Patient Evaluation and Consultation  Referring Provider: Steva Ready, DO PCP: Geryl Rankins, MD Date of Service: 06/22/2020  SUBJECTIVE Chief Complaint: New Patient (Initial Visit) (Referral for incontinence)  History of Present Illness: Karen Guzman is a 50 y.o. White or Caucasian female seen in consultation at the request of Dr. Connye Burkitt for evaluation of mixed incontinence.    Review of records from Dr Connye Burkitt significant for: Started estrace cream for vaginal dryness. Has symptoms of stress leakage, overactive bladder. Feels she does not always empty her bladder.   Urinary Symptoms: Leaks urine with cough/ sneeze, laughing, with a full bladder, with movement to the bathroom and with urgency. UUI> SUI.  Leaks 1 time(s) per day.  Pad use: none She is bothered by her UI symptoms.  Day time voids 5.  Nocturia: 0 times per night to void. Voiding dysfunction: she empties her bladder well but sometimes leaks with standing right after.  does not use a catheter to empty bladder.  When urinating, she feels dribbling after finishing and the need to urinate multiple times in a row Drinks: hot tea in AM (black), 2 cups black tea during day, 24 oz water (sometime with lemon or seltzer) per day  UTIs: 0 UTI's in the last year.   Denies history of blood in urine and kidney or bladder stones  Pelvic Organ Prolapse Symptoms:                  She Denies a feeling of a bulge the vaginal area.   Bowel Symptom: Bowel movements: 2 time(s) per day Stool consistency: soft  Straining: no.  Splinting: no.  Incomplete evacuation: no.  She Denies accidental bowel leakage / fecal incontinence Bowel regimen: none Last colonoscopy: Date 06/2019, Results negative  Sexual Function Sexually active: yes.  Sexual orientation: heterosexual Pain with sex: Yes, has discomfort due to dryness Never started vaginal estrogen because never got the prescription at her  pharmacy  Pelvic Pain Denies pelvic pain  Past Medical History:  Past Medical History:  Diagnosis Date  . Anemia   . Thyroid disease      Past Surgical History:   Past Surgical History:  Procedure Laterality Date  . CESAREAN SECTION       Past OB/GYN History: OB History  Gravida Para Term Preterm AB Living  3 2   1   2   SAB IAB Ectopic Multiple Live Births               # Outcome Date GA Lbr Len/2nd Weight Sex Delivery Anes PTL Lv  3 Gravida      CS-LTranv     2 Para      CS-LTranv     1 Preterm         FD   Menopausal: No, LMP No LMP recorded. (Menstrual status: IUD). Contraception: Mirena IUD Last pap smear was 2021.  Any history of abnormal pap smears: no.   Medications: She has a current medication list which includes the following prescription(s): cyanocobalamin and [START ON 06/23/2020] estradiol.   Allergies: Patient is allergic to penicillins.   Social History:  Social History   Tobacco Use  . Smoking status: Never Smoker  . Smokeless tobacco: Never Used  Vaping Use  . Vaping Use: Never used  Substance Use Topics  . Alcohol use: Yes    Alcohol/week: 3.0 standard drinks    Types: 3 Standard drinks or equivalent per week    Comment: socially  .  Drug use: No    Relationship status: married She lives with husband and 2 children.   She is employed as a Veterinary surgeon. Regular exercise: No History of abuse: No  Family History:   Family History  Problem Relation Age of Onset  . Hyperlipidemia Father   . Thyroid disease Father   . Thyroid disease Paternal Aunt      Review of Systems: Review of Systems  Constitutional: Negative for fever, malaise/fatigue and weight loss.  Respiratory: Negative for cough, shortness of breath and wheezing.   Cardiovascular: Negative for chest pain, palpitations and leg swelling.  Gastrointestinal: Negative for abdominal pain and blood in stool.  Genitourinary: Negative for dysuria.  Musculoskeletal: Negative for  myalgias.  Skin: Negative for rash.  Neurological: Negative for dizziness and headaches.  Endo/Heme/Allergies: Does not bruise/bleed easily.  Psychiatric/Behavioral: Negative for depression. The patient is not nervous/anxious.     OBJECTIVE Physical Exam: Vitals:   06/22/20 1036  BP: 118/70  Pulse: 76  Weight: 138 lb 8 oz (62.8 kg)  Height: 5\' 4"  (1.626 m)    Physical Exam Constitutional:      General: She is not in acute distress. Pulmonary:     Effort: Pulmonary effort is normal.  Abdominal:     General: There is no distension.     Palpations: Abdomen is soft.     Tenderness: There is no abdominal tenderness. There is no rebound.  Musculoskeletal:        General: No swelling. Normal range of motion.  Skin:    General: Skin is warm and dry.     Findings: No rash.  Neurological:     Mental Status: She is alert and oriented to person, place, and time.  Psychiatric:        Mood and Affect: Mood normal.        Behavior: Behavior normal.     GU / Detailed Urogynecologic Evaluation:  Pelvic Exam: Normal external female genitalia; Bartholin's and Skene's glands normal in appearance; urethral meatus normal in appearance, no urethral masses or discharge.   CST: negative  Speculum exam reveals normal vaginal mucosa with atrophy. Cervix normal appearance. Uterus normal single, nontender. Adnexa no mass, fullness, tenderness.    Pelvic floor strength I/V  Pelvic floor musculature: Right levator non-tender, Right obturator non-tender, Left levator non-tender, Left obturator non-tender  POP-Q:   POP-Q  -2                                            Aa   -2                                           Ba  -8                                              C   3                                            Gh  3  Pb  10                                            tvl   -2                                            Ap  -2                                             Bp  -8.5                                              D     Rectal Exam:  Normal external rectum  Post-Void Residual (PVR) by Bladder Scan: In order to evaluate bladder emptying, we discussed obtaining a postvoid residual and she agreed to this procedure.  Procedure: The ultrasound unit was placed on the patient's abdomen in the suprapubic region after the patient had voided. A PVR of 0 ml was obtained by bladder scan.  Laboratory Results: POC urine: negative  I visualized the urine specimen, noting the specimen to be clear yellow  ASSESSMENT AND PLAN Ms. Watrous is a 50 y.o. with:  1. Urinary frequency   2. Urinary urgency   3. SUI (stress urinary incontinence, female)   4. Vaginal atrophy    1. Urgency/ frequency We discussed the symptoms of overactive bladder (OAB), which include urinary urgency, urinary frequency, nocturia, with or without urge incontinence.  While we do not know the exact etiology of OAB, several treatment options exist. We discussed management including behavioral therapy (decreasing bladder irritants, urge suppression strategies, timed voids, bladder retraining), physical therapy, medication; for refractory cases posterior tibial nerve stimulation, sacral neuromodulation, and intravesical botulinum toxin injection.  - She is interested in pelvic floor physical therapy, referral placed. She was also given information about the [employer] pelvic floor trainer, but prefers to have something covered by insurance.   2. SUI For treatment of stress urinary incontinence,  non-surgical options include expectant management, physical therapy, as well as a pessary.   - This is less bothersome to her than the UUI. We discussed that pelvic floor PT can be helpful for both types of leakage.  3. Vaginal atrophy - Prescribed estrace cream 0.5g nightly for two weeks then twice a week after. She can use vaginal moisturizer in between for  intercourse. Discussed natural options such as coconut oil or vitamin E, vs water based or silicone lubricant.    Follow up 2 months to assess progress  Marguerita Beards, MD   Medical Decision Making:  - Reviewed/ ordered a clinical laboratory test - Review and summation of prior records

## 2020-06-22 ENCOUNTER — Ambulatory Visit (INDEPENDENT_AMBULATORY_CARE_PROVIDER_SITE_OTHER): Payer: BC Managed Care – PPO | Admitting: Obstetrics and Gynecology

## 2020-06-22 ENCOUNTER — Other Ambulatory Visit: Payer: Self-pay

## 2020-06-22 ENCOUNTER — Encounter: Payer: Self-pay | Admitting: Obstetrics and Gynecology

## 2020-06-22 VITALS — BP 118/70 | HR 76 | Ht 64.0 in | Wt 138.5 lb

## 2020-06-22 DIAGNOSIS — R350 Frequency of micturition: Secondary | ICD-10-CM

## 2020-06-22 DIAGNOSIS — N952 Postmenopausal atrophic vaginitis: Secondary | ICD-10-CM | POA: Diagnosis not present

## 2020-06-22 DIAGNOSIS — R3915 Urgency of urination: Secondary | ICD-10-CM | POA: Diagnosis not present

## 2020-06-22 DIAGNOSIS — N393 Stress incontinence (female) (male): Secondary | ICD-10-CM | POA: Diagnosis not present

## 2020-06-22 LAB — POCT URINALYSIS DIPSTICK
Appearance: NORMAL
Bilirubin, UA: NEGATIVE
Blood, UA: NEGATIVE
Glucose, UA: NEGATIVE
Ketones, UA: NEGATIVE
Leukocytes, UA: NEGATIVE
Nitrite, UA: NEGATIVE
Protein, UA: NEGATIVE
Spec Grav, UA: 1.01 (ref 1.010–1.025)
Urobilinogen, UA: 0.2 E.U./dL
pH, UA: 6.5 (ref 5.0–8.0)

## 2020-06-22 MED ORDER — ESTRADIOL 0.1 MG/GM VA CREA: 0.5000 g | TOPICAL_CREAM | VAGINAL | 11 refills | Status: AC

## 2020-06-22 NOTE — Patient Instructions (Signed)
Today we talked about ways to manage bladder urgency such as altering your diet to avoid irritative beverages and foods (bladder diet) as well as attempting to decrease stress and other exacerbating factors.    The Most Bothersome Foods* The Least Bothersome Foods*  Coffee - Regular & Decaf Tea - caffeinated Carbonated beverages - cola, non-colas, diet & caffeine-free Alcohols - Beer, Red Wine, White Wine, [address] - Grapefruit, [place], Orange, [employer] - Cranberry, Grapefruit, Orange, Pineapple Vegetables - Tomato & Tomato Products Flavor Enhancers - Hot peppers, Spicy foods, Chili, Horseradish, Vinegar, Monosodium glutamate (MSG) Artificial Sweeteners - NutraSweet, Sweet 'N Low, Equal (sweetener), Saccharin Ethnic foods - [place], [place], [place] food [employer] - low-fat & whole Fruits - Bananas, Blueberries, Honeydew melon, Pears, Raisins, Watermelon Vegetables - Broccoli, [address] Sprouts, [place], Carrots, Cauliflower, [place], Cucumber, Mushrooms, Peas, Radishes, Squash, Zucchini, White potatoes, Sweet potatoes & yams Poultry - Chicken, Eggs, [place], [employer] - Beef, Diplomatic Services operational officer, Lamb Seafood - Shrimp, [place] fish, Salmon Grains - Oat, Rice Snacks - Pretzels, Popcorn  *Lenward Chancellor et al. Diet and its role in interstitial cystitis/bladder pain syndrome (IC/BPS) and comorbid conditions. [employer]. BJU Int. 2012 Jan 11.    Vulvovaginal moisturizer Options: Marland Kitchen Vitamin E oil (pump or capsule) or cream (Gene's Vit E Cream) . Coconut oil . Silicone-based lubricant for use during intercourse ("wet platinum" is a brand available at most drugstores, "uberlube") . Crisco Consider the ingredients of the product - the fewer the ingredients the better!  Directions for Use: Clean and dry your hands Gently dab the vulvar/vaginal area dry as needed Apply a "pea-sized" amount of the moisturizer onto your fingertip Using you other hand, open the labia  Apply the moisturizer to  the vulvar/vaginal tissues Wear loose fitting underwear/clothing if possible following application Use moisturize up to 3 times daily as desired.

## 2020-07-18 DIAGNOSIS — D509 Iron deficiency anemia, unspecified: Secondary | ICD-10-CM | POA: Diagnosis not present

## 2020-07-20 ENCOUNTER — Other Ambulatory Visit: Payer: Self-pay | Admitting: Gastroenterology

## 2020-07-20 DIAGNOSIS — E164 Increased secretion of gastrin: Secondary | ICD-10-CM

## 2020-07-27 ENCOUNTER — Other Ambulatory Visit (HOSPITAL_COMMUNITY): Payer: Self-pay

## 2020-07-27 NOTE — Discharge Instructions (Signed)
Ferumoxytol injection What is this medicine? FERUMOXYTOL is an iron complex. Iron is used to make healthy red blood cells, which carry oxygen and nutrients throughout the body. This medicine is used to treat iron deficiency anemia. This medicine may be used for other purposes; ask your health care provider or pharmacist if you have questions. COMMON BRAND NAME(S): Feraheme What should I tell my health care provider before I take this medicine? They need to know if you have any of these conditions:  anemia not caused by low iron levels  high levels of iron in the blood  magnetic resonance imaging (MRI) test scheduled  an unusual or allergic reaction to iron, other medicines, foods, dyes, or preservatives  pregnant or trying to get pregnant  breast-feeding How should I use this medicine? This medicine is for injection into a vein. It is given by a health care professional in a hospital or clinic setting. Talk to your pediatrician regarding the use of this medicine in children. Special care may be needed. Overdosage: If you think you have taken too much of this medicine contact a poison control center or emergency room at once. NOTE: This medicine is only for you. Do not share this medicine with others. What if I miss a dose? It is important not to miss your dose. Call your doctor or health care professional if you are unable to keep an appointment. What may interact with this medicine? This medicine may interact with the following medications:  other iron products This list may not describe all possible interactions. Give your health care provider a list of all the medicines, herbs, non-prescription drugs, or dietary supplements you use. Also tell them if you smoke, drink alcohol, or use illegal drugs. Some items may interact with your medicine. What should I watch for while using this medicine? Visit your doctor or healthcare professional regularly. Tell your doctor or healthcare  professional if your symptoms do not start to get better or if they get worse. You may need blood work done while you are taking this medicine. You may need to follow a special diet. Talk to your doctor. Foods that contain iron include: whole grains/cereals, dried fruits, beans, or peas, leafy green vegetables, and organ meats (liver, kidney). What side effects may I notice from receiving this medicine? Side effects that you should report to your doctor or health care professional as soon as possible:  allergic reactions like skin rash, itching or hives, swelling of the face, lips, or tongue  breathing problems  changes in blood pressure  feeling faint or lightheaded, falls  fever or chills  flushing, sweating, or hot feelings  swelling of the ankles or feet Side effects that usually do not require medical attention (report to your doctor or health care professional if they continue or are bothersome):  diarrhea  headache  nausea, vomiting  stomach pain This list may not describe all possible side effects. Call your doctor for medical advice about side effects. You may report side effects to FDA at 1-800-FDA-1088. Where should I keep my medicine? This drug is given in a hospital or clinic and will not be stored at home. NOTE: This sheet is a summary. It may not cover all possible information. If you have questions about this medicine, talk to your doctor, pharmacist, or health care provider.  2021 Elsevier/Gold Standard (2016-05-28 20:21:10)  

## 2020-07-28 ENCOUNTER — Other Ambulatory Visit: Payer: Self-pay

## 2020-07-28 ENCOUNTER — Ambulatory Visit (HOSPITAL_COMMUNITY)
Admission: RE | Admit: 2020-07-28 | Discharge: 2020-07-28 | Disposition: A | Discharge status: Home / Self Care | Payer: BC Managed Care – PPO | Source: Ambulatory Visit | Attending: Gastroenterology | Admitting: Gastroenterology

## 2020-07-28 DIAGNOSIS — D509 Iron deficiency anemia, unspecified: Secondary | ICD-10-CM | POA: Insufficient documentation

## 2020-07-28 MED ORDER — DIPHENHYDRAMINE HCL 50 MG/ML IJ SOLN
INTRAMUSCULAR | Status: AC
  Administered 2020-07-28: 25 mg via INTRAVENOUS
  Filled 2020-07-28: qty 1

## 2020-07-28 MED ORDER — DIPHENHYDRAMINE HCL 50 MG/ML IJ SOLN: 25.0000 mg | Freq: Once | INTRAMUSCULAR | Status: AC

## 2020-07-28 MED ORDER — SODIUM CHLORIDE 0.9 % IV SOLN
510.0000 mg | INTRAVENOUS | Status: DC
  Administered 2020-07-28: 510 mg via INTRAVENOUS
  Filled 2020-07-28: qty 510

## 2020-07-28 NOTE — Progress Notes (Signed)
Pt reports feeling better. Denies feeling of throat thickness and mouth, tongue, throat tingling. No neck or facial redness present. DC home at this time.

## 2020-07-28 NOTE — Progress Notes (Signed)
IV Feraheme started at 1020. At 1028 pt called out reporting throat feeling thick with mouth, tongue and throat tingling. Neck and facial redness noted. Feraheme stopped at 1028. VSS. 1035 neck and facial redness gone but continued to report throat feeling thick with tongue, mouth and throat tingling. Called Dr. Laurey Morale office, spoke and informed Amy of reaction. 25mg  IV Benadryl ordered and given at 1100. Instructed to monitor pt for 30 minutes after Benadryl and can then be DC home. Pt verbalized understanding to call Dr. office and ask to speak to Amy if symptoms return once pt gets home.

## 2020-08-04 ENCOUNTER — Encounter (HOSPITAL_COMMUNITY): Payer: BC Managed Care – PPO

## 2020-08-04 ENCOUNTER — Other Ambulatory Visit: Payer: Self-pay

## 2020-08-04 ENCOUNTER — Ambulatory Visit
Admission: RE | Admit: 2020-08-04 | Discharge: 2020-08-04 | Disposition: A | Discharge status: Home / Self Care | Payer: BC Managed Care – PPO | Source: Ambulatory Visit | Attending: Gastroenterology | Admitting: Gastroenterology

## 2020-08-04 DIAGNOSIS — E164 Increased secretion of gastrin: Secondary | ICD-10-CM

## 2020-08-04 DIAGNOSIS — N838 Other noninflammatory disorders of ovary, fallopian tube and broad ligament: Secondary | ICD-10-CM | POA: Diagnosis not present

## 2020-08-04 DIAGNOSIS — R935 Abnormal findings on diagnostic imaging of other abdominal regions, including retroperitoneum: Secondary | ICD-10-CM | POA: Diagnosis not present

## 2020-08-04 DIAGNOSIS — N83201 Unspecified ovarian cyst, right side: Secondary | ICD-10-CM | POA: Diagnosis not present

## 2020-08-04 MED ORDER — IOPAMIDOL (ISOVUE-300) INJECTION 61%
100.0000 mL | Freq: Once | INTRAVENOUS | Status: AC | PRN
  Administered 2020-08-04: 100 mL via INTRAVENOUS

## 2020-08-17 DIAGNOSIS — K3189 Other diseases of stomach and duodenum: Secondary | ICD-10-CM | POA: Diagnosis not present

## 2020-08-17 DIAGNOSIS — K2930 Chronic superficial gastritis without bleeding: Secondary | ICD-10-CM | POA: Diagnosis not present

## 2020-08-17 DIAGNOSIS — K31A19 Gastric intestinal metaplasia without dysplasia, unspecified site: Secondary | ICD-10-CM | POA: Diagnosis not present

## 2020-08-17 DIAGNOSIS — K31A15 Gastric intestinal metaplasia without dysplasia, involving multiple sites: Secondary | ICD-10-CM | POA: Diagnosis not present

## 2020-08-17 DIAGNOSIS — K2940 Chronic atrophic gastritis without bleeding: Secondary | ICD-10-CM | POA: Diagnosis not present

## 2020-08-18 ENCOUNTER — Ambulatory Visit: Payer: BC Managed Care – PPO | Admitting: Physical Therapy

## 2020-08-19 DIAGNOSIS — F321 Major depressive disorder, single episode, moderate: Secondary | ICD-10-CM | POA: Diagnosis not present

## 2020-08-19 DIAGNOSIS — F411 Generalized anxiety disorder: Secondary | ICD-10-CM | POA: Diagnosis not present

## 2020-08-22 ENCOUNTER — Ambulatory Visit: Payer: BC Managed Care – PPO | Attending: Obstetrics and Gynecology | Admitting: Physical Therapy

## 2020-08-22 ENCOUNTER — Encounter: Payer: Self-pay | Admitting: Physical Therapy

## 2020-08-22 ENCOUNTER — Other Ambulatory Visit: Payer: Self-pay

## 2020-08-22 DIAGNOSIS — M6281 Muscle weakness (generalized): Secondary | ICD-10-CM | POA: Diagnosis not present

## 2020-08-22 DIAGNOSIS — R279 Unspecified lack of coordination: Secondary | ICD-10-CM | POA: Diagnosis not present

## 2020-08-22 DIAGNOSIS — D509 Iron deficiency anemia, unspecified: Secondary | ICD-10-CM | POA: Diagnosis not present

## 2020-08-22 NOTE — Therapy (Signed)
[facility] [address], [address] [place], [place], [address] Phone: [phone]   Fax:  [phone]  Physical Therapy Evaluation  Patient Details  Name: Karen Guzman MRN: [id number] Date of Birth: February 11, 1971 Referring Provider (PT): Marguerita Beards, MD   Encounter Date: 08/22/2020   PT End of Session - 08/22/20 1020    Visit Number 1    Date for PT Re-Evaluation 11/14/20    Authorization Type bcbs    PT Start Time 1020    PT Stop Time 1055    PT Time Calculation (min) 35 min    Activity Tolerance Patient tolerated treatment well    Behavior During Therapy [facility] for tasks assessed/performed           Past Medical History:  Diagnosis Date  . Anemia   . Thyroid disease     Past Surgical History:  Procedure Laterality Date  . CESAREAN SECTION      There were no vitals filed for this visit.    Subjective Assessment - 08/22/20 1021    Subjective I have been having leakage with sneezing, coughing, and when I am trying to get to the bathroom.  Pt states she goes to the bathroom every couple of hours. Pt has children and states she wears panty liners  but not usually.  Leakage happens daily at least a few times/day. Gardening for exercise/leisure. States she is doing kegel.  I have sciatic that occasionally flares up    Patient Stated Goals get rid of leakage    Currently in Pain? No/denies              [facility] PT Assessment - 08/22/20 0001      Assessment   Medical Diagnosis R39.15 (ICD-10-CM) - Urinary urgency; N39.3 (ICD-10-CM) - SUI (stress urinary incontinence, female    Referring Provider (PT) Marguerita Beards, MD    Onset Date/Surgical Date --   worse in the last 5 years   Prior Therapy No      Precautions   Precautions None      Balance Screen   Has the patient fallen in the past 6 months No      Home Environment   Living Environment Private residence    Living Arrangements  Spouse/significant other;Children   15/13 y/o     Prior Function   Level of Independence Independent    Vocation Full time employment    Vocation Requirements driving, walking, desk      Cognition   Overall Cognitive Status Within Functional Limits for tasks assessed      Posture/Postural Control   Posture/Postural Control Postural limitations    Postural Limitations Anterior pelvic tilt      ROM / Strength   AROM / PROM / Strength AROM;Strength      AROM   Overall AROM Comments lumbar flexion 75%      Strength   Overall Strength Comments 5/5 hip bil      Flexibility   Soft Tissue Assessment /Muscle Length yes    Hamstrings 70% bil      Palpation   Palpation comment lumbar tight      Ambulation/Gait   Gait Pattern Within Functional Limits                      Objective measurements completed on examination: See above findings.     Pelvic Floor Special Questions - 08/22/20 0001    Prior Pregnancies Yes  Number of Pregnancies 2    Number of C-Sections 2    Urinary Leakage Yes    How often several x per day    Pad use rarely    Activities that cause leaking With strong urge;Coughing;Sneezing;Laughing    Urinary urgency Yes    Urinary frequency at least every two hours    Fecal incontinence No    Fluid intake 3-8 glasses    Falling out feeling (prolapse) No    Skin Integrity Intact    Prolapse None    Pelvic Floor Internal Exam pt identity confirmed and informed consent given to perform internal soft tissue assessment    Exam Type Vaginal    Palpation stronger on Rt side; elevated tone and difficulty getting muscle to contract; once contracted very slow to relax    Strength Flicker    Strength # of reps 1    Strength # of seconds 1    Tone high                    PT Education - 08/22/20 1706    Education Details educated on exhale when doing kegel and extra time to rest    Person(s) Educated Patient    Methods Explanation     Comprehension Verbalized understanding            PT Short Term Goals - 08/22/20 1129      PT SHORT TERM GOAL #1   Title ind with doing kegel correctly    Time 6    Period Weeks    Status New    Target Date 10/03/20      PT SHORT TERM GOAL #2   Title Pt will be ind with initial HEP    Time 6    Period Weeks    Status New    Target Date 10/03/20             PT Long Term Goals - 08/22/20 1116      PT LONG TERM GOAL #1   Title Pt will be ind with advanced HEP    Time 12    Period Weeks    Status New    Target Date 11/14/20      PT LONG TERM GOAL #2   Title Pt will report she is able to cough or sneeze without leakage due to improved quick flick    Time 12    Period Weeks    Status New    Target Date 11/14/20      PT LONG TERM GOAL #3   Title Pt will report at least 75% less leakage due to imporved strength and coordination    Time 12    Period Weeks    Status New    Target Date 11/14/20                  Plan - 08/22/20 1137    Clinical Impression Statement Pt presents to clinic due to worsening leakage over the past several years.  Pt is noticing increased frequency of voiding.  MMT 1/5 hold for 1 sec, slow time to activate and release muscles.  Overusing gluteals when doing the kegel.  Pt unable to do quick flick at this time. Pt has tight hamstrings, adductors, and lumbar paraspinlas.  She has 5/5 hip strength.  Based on her presentation, she will benefit from skilled PT to address impairments and return to functional activities wihtout leakage.    Examination-Activity Limitations Toileting;Continence  Stability/Clinical Decision Making Stable/Uncomplicated    Clinical Decision Making Low    Rehab Potential Excellent    PT Frequency 1x / week    PT Duration 12 weeks    PT Treatment/Interventions ADLs/Self Care Home Management;Biofeedback;Cryotherapy;Electrical Stimulation;Moist Heat;Therapeutic activities;Therapeutic exercise;Neuromuscular  re-education;Taping;Dry needling;Manual techniques;Passive range of motion    PT Next Visit Plan palpation externally to ensure correctly kegel; positioning to work on "finding" the contraction, sitting with press/lift, standing leaning, prone, adduction, lumbar rotation, h/s stretch    Consulted and Agree with Plan of Care Patient           Patient will benefit from skilled therapeutic intervention in order to improve the following deficits and impairments:  Decreased strength,Decreased coordination,Increased muscle spasms  Visit Diagnosis: Muscle weakness (generalized)  Unspecified lack of coordination     Problem List Patient Active Problem List   Diagnosis Date Noted  . Viral syndrome 03/01/2015  . Iron deficiency anemia 12/09/2014  . Hashimoto's thyroiditis 03/12/2014    Junious Silk, PT 08/22/2020, 5:13 PM  [facility] [address] [place], [place], [address] Phone: [phone]   Fax:  [phone]  Name: Karen Guzman MRN: [id number] Date of Birth: 1971/01/22

## 2020-08-23 ENCOUNTER — Ambulatory Visit: Payer: BC Managed Care – PPO | Admitting: Obstetrics and Gynecology

## 2020-09-01 ENCOUNTER — Other Ambulatory Visit: Payer: Self-pay | Admitting: Gastroenterology

## 2020-09-01 DIAGNOSIS — K2940 Chronic atrophic gastritis without bleeding: Secondary | ICD-10-CM

## 2020-09-01 DIAGNOSIS — E164 Increased secretion of gastrin: Secondary | ICD-10-CM

## 2020-09-12 DIAGNOSIS — F321 Major depressive disorder, single episode, moderate: Secondary | ICD-10-CM | POA: Diagnosis not present

## 2020-09-12 DIAGNOSIS — F411 Generalized anxiety disorder: Secondary | ICD-10-CM | POA: Diagnosis not present

## 2020-09-20 ENCOUNTER — Encounter: Payer: BC Managed Care – PPO | Admitting: Physical Therapy

## 2020-09-22 DIAGNOSIS — D509 Iron deficiency anemia, unspecified: Secondary | ICD-10-CM | POA: Diagnosis not present

## 2020-10-03 ENCOUNTER — Encounter: Payer: Self-pay | Admitting: Physical Therapy

## 2020-10-03 ENCOUNTER — Ambulatory Visit
Admission: RE | Admit: 2020-10-03 | Discharge: 2020-10-03 | Disposition: A | Discharge status: Home / Self Care | Payer: BC Managed Care – PPO | Source: Ambulatory Visit | Attending: Gastroenterology | Admitting: Gastroenterology

## 2020-10-03 ENCOUNTER — Other Ambulatory Visit: Payer: Self-pay

## 2020-10-03 ENCOUNTER — Ambulatory Visit: Payer: BC Managed Care – PPO | Attending: Obstetrics and Gynecology | Admitting: Physical Therapy

## 2020-10-03 DIAGNOSIS — M6281 Muscle weakness (generalized): Secondary | ICD-10-CM | POA: Diagnosis not present

## 2020-10-03 DIAGNOSIS — E164 Increased secretion of gastrin: Secondary | ICD-10-CM

## 2020-10-03 DIAGNOSIS — K2940 Chronic atrophic gastritis without bleeding: Secondary | ICD-10-CM

## 2020-10-03 DIAGNOSIS — R279 Unspecified lack of coordination: Secondary | ICD-10-CM | POA: Diagnosis not present

## 2020-10-03 MED ORDER — GADOBENATE DIMEGLUMINE 529 MG/ML IV SOLN
12.0000 mL | Freq: Once | INTRAVENOUS | Status: AC | PRN
  Administered 2020-10-03: 12 mL via INTRAVENOUS

## 2020-10-03 NOTE — Therapy (Signed)
[facility] [address], [address] [place], [place], [address] Phone: [phone]   Fax:  [phone]  Physical Therapy Treatment  Patient Details  Name: Karen Guzman MRN: [id number] Date of Birth: [birth year] Referring Provider (PT): Marguerita Beards, MD   Encounter Date: 10/03/2020   PT End of Session - 10/03/20 1022     Visit Number 2    Date for PT Re-Evaluation 11/14/20    Authorization Type bcbs    PT Start Time 1018    PT Stop Time 1058    PT Time Calculation (min) 40 min    Activity Tolerance Patient tolerated treatment well    Behavior During Therapy [facility] for tasks assessed/performed             Past Medical History:  Diagnosis Date   Anemia    Thyroid disease     Past Surgical History:  Procedure Laterality Date   CESAREAN SECTION      There were no vitals filed for this visit.   Subjective Assessment - 10/03/20 1020     Subjective Pt states she is about 70% improved but still having leakage with coughing or sneezing.    Patient Stated Goals get rid of leakage    Currently in Pain? No/denies                               [facility] Adult PT Treatment/Exercise - 10/03/20 0001       Exercises   Exercises Lumbar      Lumbar Exercises: Stretches   Active Hamstring Stretch 2 reps;30 seconds    Double Knee to Chest Stretch Limitations happy baby      Lumbar Exercises: Quadruped   Other Quadruped Lumbar Exercises kegel in table top and leaning on table                    PT Education - 10/03/20 1100     Education Details Access Code: [email or web address]    Person(s) Educated Patient    Methods Explanation;Demonstration;Tactile cues;Verbal cues;Handout    Comprehension Verbalized understanding;Returned demonstration              PT Short Term Goals - 10/03/20 1053       PT SHORT TERM GOAL #1   Title ind with doing kegel correctly    Status Achieved      PT  SHORT TERM GOAL #2   Title Pt will be ind with initial HEP    Status Achieved               PT Long Term Goals - 08/22/20 1116       PT LONG TERM GOAL #1   Title Pt will be ind with advanced HEP    Time 12    Period Weeks    Status New    Target Date 11/14/20      PT LONG TERM GOAL #2   Title Pt will report she is able to cough or sneeze without leakage due to improved quick flick    Time 12    Period Weeks    Status New    Target Date 11/14/20      PT LONG TERM GOAL #3   Title Pt will report at least 75% less leakage due to imporved strength and coordination    Time 12    Period Weeks    Status New  Target Date 11/14/20                   Plan - 10/03/20 1053     Clinical Impression Statement Pt has already had success with kegel and has experienced decreased leakage.  Pt was given basic core and pelvic floor exercise with stretches so she can progress strength and endurance and educated on stretched after each set.    PT Treatment/Interventions ADLs/Self Care Home Management;Biofeedback;Cryotherapy;Electrical Stimulation;Moist Heat;Therapeutic activities;Therapeutic exercise;Neuromuscular re-education;Taping;Dry needling;Manual techniques;Passive range of motion    PT Next Visit Plan palpation externally to ensure correctly kegel; f/u on stretches and adding quadruped, palpate externally to see if improved contraction    PT Home Exercise Plan Access Code: [email or web address]    Consulted and Agree with Plan of Care Patient             Patient will benefit from skilled therapeutic intervention in order to improve the following deficits and impairments:  Decreased strength, Decreased coordination, Increased muscle spasms  Visit Diagnosis: Muscle weakness (generalized)  Unspecified lack of coordination     Problem List Patient Active Problem List   Diagnosis Date Noted   Viral syndrome 03/01/2015   Iron deficiency anemia 12/09/2014   Hashimoto's  thyroiditis 03/12/2014    Brayton Caves Paulanthony Gleaves, PT 10/03/2020, 11:03 AM  [facility] [address] [place], [place], [address] Phone: [phone]   Fax:  [phone]  Name: DEYONNA FITZSIMMONS MRN: [id number] Date of Birth: 1970/12/26

## 2020-10-03 NOTE — Patient Instructions (Signed)
Access Code: [email or web address] URL: [email or web address] Date: 10/03/2020 Prepared by: Dwana Curd  Exercises Supine Diaphragmatic Breathing - 3 x daily - 7 x weekly - 1 sets - 10 reps Supine Hamstring Stretch with Strap - 1 x daily - 7 x weekly - 1 sets - 2 reps - 1-2 min hold Supine Piriformis Stretch - 1 x daily - 7 x weekly - 1 sets - 2 reps - 1-2 min hold Bound Angle with Neutral Spine - 1 x daily - 7 x weekly - 1 sets - 1 reps - 1-2 min hold Supine Butterfly Groin Stretch - 1 x daily - 7 x weekly - 1 sets - 3 reps - 30 sec hold Quadruped Exhale with Pelvic Floor Contraction and Arm Raise - 1 x daily - 7 x weekly - 3 sets - 10 reps Mini Squat with Pelvic Floor Contraction - 1 x daily - 7 x weekly - 2 sets - 10 reps Sumo Squat with Dumbbell - 1 x daily - 7 x weekly - 1 sets - 10 reps Forward T - 1 x daily - 7 x weekly - 1 sets - 10 reps

## 2020-10-12 ENCOUNTER — Encounter: Payer: BC Managed Care – PPO | Admitting: Physical Therapy

## 2020-10-12 DIAGNOSIS — Z6823 Body mass index (BMI) 23.0-23.9, adult: Secondary | ICD-10-CM | POA: Diagnosis not present

## 2020-10-12 DIAGNOSIS — D508 Other iron deficiency anemias: Secondary | ICD-10-CM | POA: Diagnosis not present

## 2020-10-12 DIAGNOSIS — K2940 Chronic atrophic gastritis without bleeding: Secondary | ICD-10-CM | POA: Diagnosis not present

## 2020-10-17 ENCOUNTER — Other Ambulatory Visit: Payer: Self-pay

## 2020-10-17 ENCOUNTER — Ambulatory Visit: Payer: BC Managed Care – PPO | Admitting: Physical Therapy

## 2020-10-17 ENCOUNTER — Encounter: Payer: Self-pay | Admitting: Physical Therapy

## 2020-10-17 DIAGNOSIS — R279 Unspecified lack of coordination: Secondary | ICD-10-CM | POA: Diagnosis not present

## 2020-10-17 DIAGNOSIS — M6281 Muscle weakness (generalized): Secondary | ICD-10-CM | POA: Diagnosis not present

## 2020-10-17 NOTE — Patient Instructions (Signed)
Access Code: [email or web address] URL: [email or web address] Date: 10/17/2020 Prepared by: Dwana Curd  Exercises Supine Diaphragmatic Breathing - 3 x daily - 7 x weekly - 1 sets - 10 reps Supine Hamstring Stretch with Strap - 1 x daily - 7 x weekly - 1 sets - 2 reps - 1-2 min hold Supine Piriformis Stretch - 1 x daily - 7 x weekly - 1 sets - 2 reps - 1-2 min hold Bound Angle with Neutral Spine - 1 x daily - 7 x weekly - 1 sets - 1 reps - 1-2 min hold Supine Butterfly Groin Stretch - 1 x daily - 7 x weekly - 1 sets - 3 reps - 30 sec hold Mini Squat with Pelvic Floor Contraction - 1 x daily - 7 x weekly - 2 sets - 10 reps Sumo Squat with Dumbbell - 1 x daily - 7 x weekly - 1 sets - 10 reps Supine Hip Adductor Squeeze with Small Ball - 3 x daily - 7 x weekly - 1 sets - 10 reps - 3 sec hold Seated Pelvic Floor Contraction with Isometric Hip Adduction - 3 x daily - 7 x weekly - 10 reps - 1 sets - 3 sechold, 3 sec rest hold Side Stepping with Resistance at Ankles - 1 x daily - 7 x weekly - 3 sets - 10 reps Standing Hip Extension Kicks - 1 x daily - 7 x weekly - 3 sets - 10 reps Standing Hip Flexion with Resistance Loop - 1 x daily - 7 x weekly - 3 sets - 10 reps

## 2020-10-17 NOTE — Therapy (Signed)
[facility] [address] [place], [place], [address] Phone: [phone]   Fax:  [phone]  Physical Therapy Treatment  Patient Details  Name: Karen Guzman MRN: [id number] Date of Birth: 1970/12/26 Referring Provider (PT): Marguerita Beards, MD   Encounter Date: 10/17/2020   PT End of Session - 10/17/20 0908     Visit Number 3    Date for PT Re-Evaluation 11/14/20    Authorization Type bcbs    PT Start Time 0849    PT Stop Time 0929    PT Time Calculation (min) 40 min    Activity Tolerance Patient tolerated treatment well    Behavior During Therapy [facility] for tasks assessed/performed             Past Medical History:  Diagnosis Date   Anemia    Thyroid disease     Past Surgical History:  Procedure Laterality Date   CESAREAN SECTION      There were no vitals filed for this visit.   Subjective Assessment - 10/17/20 0853     Subjective It is better but coughing or sneezing stll having leakage.  I haven't done the exercises as much been busy with kids going to a lot of camps    Patient Stated Goals get rid of leakage    Currently in Pain? No/denies                               [facility] Adult PT Treatment/Exercise - 10/17/20 0001       Neuro Re-ed    Neuro Re-ed Details  coordinating breahting with kegel      Lumbar Exercises: Stretches   Active Hamstring Stretch 2 reps;30 seconds    Double Knee to Chest Stretch Limitations happy baby    Figure 4 Stretch 3 reps;30 seconds      Lumbar Exercises: Standing   Other Standing Lumbar Exercises bird dip - 10x    Other Standing Lumbar Exercises standing hip 3 ways yellow resistance- 10 x each      Lumbar Exercises: Supine   Other Supine Lumbar Exercises ball squeeze and kegel; just kegel hooklying      Lumbar Exercises: Quadruped   Single Arm Raise Right;Left;10 reps   kegel                   PT Education -  10/17/20 1002     Education Details Access Code: [email or web address]    Person(s) Educated Patient    Methods Explanation;Demonstration;Tactile cues;Verbal cues;Handout    Comprehension Verbalized understanding;Returned demonstration              PT Short Term Goals - 10/17/20 0905       PT SHORT TERM GOAL #1   Title ind with doing kegel correctly    Status Achieved      PT SHORT TERM GOAL #2   Title Pt will be ind with initial HEP    Status Achieved               PT Long Term Goals - 10/17/20 0905       PT LONG TERM GOAL #1   Title Pt will be ind with advanced HEP    Status On-going      PT LONG TERM GOAL #2   Title Pt will report she is able to cough or sneeze without leakage due to improved quick flick  Status On-going      PT LONG TERM GOAL #3   Title Pt will report at least 75% less leakage due to imporved strength and coordination    Status On-going                   Plan - 10/17/20 1004     Clinical Impression Statement Pt did well with exercises and cuing for exhaling with kegel.  Pt had a hard time and needed extra time when incorporating breathing but she was able to do more easily at the end of session.  Pt reports leakage is better but has had a hard time being consistent with HEP.  Pt was given exercises that should be easier to incorporate in daily activities.  She will benefit from skilled PT to continue working towards functional goals    PT Treatment/Interventions ADLs/Self Care Home Management;Biofeedback;Cryotherapy;Electrical Stimulation;Moist Heat;Therapeutic activities;Therapeutic exercise;Neuromuscular re-education;Taping;Dry needling;Manual techniques;Passive range of motion    PT Next Visit Plan f/u on exhale; add half kneeling and progress adding lifting and hip hinge    PT Home Exercise Plan Access Code: [email or web address]    Consulted and Agree with Plan of Care Patient             Patient will benefit from skilled therapeutic  intervention in order to improve the following deficits and impairments:  Decreased strength, Decreased coordination, Increased muscle spasms  Visit Diagnosis: Muscle weakness (generalized)  Unspecified lack of coordination     Problem List Patient Active Problem List   Diagnosis Date Noted   Viral syndrome 03/01/2015   Iron deficiency anemia 12/09/2014   Hashimoto's thyroiditis 03/12/2014    Brayton Caves Almas Rake, PT 10/17/2020, 12:04 PM  [facility] [address], [address] [place], [place], [address] Phone: [phone]   Fax:  [phone]  Name: BRYAUNA BYRUM MRN: [id number] Date of Birth: 01-19-1971

## 2020-10-27 ENCOUNTER — Encounter: Payer: BC Managed Care – PPO | Admitting: Physical Therapy

## 2020-10-28 DIAGNOSIS — D509 Iron deficiency anemia, unspecified: Secondary | ICD-10-CM | POA: Diagnosis not present

## 2020-11-03 ENCOUNTER — Encounter: Payer: BC Managed Care – PPO | Admitting: Physical Therapy

## 2020-11-10 ENCOUNTER — Encounter: Payer: BC Managed Care – PPO | Admitting: Physical Therapy

## 2020-11-22 ENCOUNTER — Ambulatory Visit: Payer: BC Managed Care – PPO | Admitting: Obstetrics and Gynecology

## 2020-11-28 DIAGNOSIS — D509 Iron deficiency anemia, unspecified: Secondary | ICD-10-CM | POA: Diagnosis not present

## 2021-01-05 ENCOUNTER — Ambulatory Visit: Payer: BC Managed Care – PPO | Admitting: Internal Medicine

## 2021-01-18 DIAGNOSIS — D509 Iron deficiency anemia, unspecified: Secondary | ICD-10-CM | POA: Diagnosis not present

## 2021-02-15 DIAGNOSIS — E164 Increased secretion of gastrin: Secondary | ICD-10-CM | POA: Diagnosis not present

## 2021-02-24 ENCOUNTER — Ambulatory Visit (INDEPENDENT_AMBULATORY_CARE_PROVIDER_SITE_OTHER): Payer: BC Managed Care – PPO | Admitting: Internal Medicine

## 2021-02-24 ENCOUNTER — Other Ambulatory Visit: Payer: Self-pay

## 2021-02-24 ENCOUNTER — Encounter: Payer: Self-pay | Admitting: Internal Medicine

## 2021-02-24 VITALS — BP 118/80 | HR 92 | Ht 64.0 in | Wt 144.2 lb

## 2021-02-24 DIAGNOSIS — E063 Autoimmune thyroiditis: Secondary | ICD-10-CM

## 2021-02-24 DIAGNOSIS — E010 Iodine-deficiency related diffuse (endemic) goiter: Secondary | ICD-10-CM

## 2021-02-24 LAB — T4, FREE: Free T4: 0.71 ng/dL (ref 0.60–1.60)

## 2021-02-24 LAB — TSH: TSH: 2.95 u[IU]/mL (ref 0.35–5.50)

## 2021-02-24 LAB — T3, FREE: T3, Free: 3.1 pg/mL (ref 2.3–4.2)

## 2021-02-24 NOTE — Progress Notes (Addendum)
Continue patient ID: Karen Guzman, female   DOB: [birth year], 50 y.o.   MRN: [id number]  This visit occurred during the SARS-CoV-2 public health emergency.  Safety protocols were in place, including screening questions prior to the visit, additional usage of staff PPE, and extensive cleaning of exam room while observing appropriate contact time as indicated for disinfecting solutions.   HPI  Karen Guzman is a 50 y.o.-year-old female, returning for f/u for Hashimoto's hypothyroidism.  Last visit was 1 year and 2 months ago.  Interim history: Since last visit, she was found to have an elevated gastrin level.  Of note, she does have a history of pernicious anemia.  She is seeing by [facility] and was also seen by GI at [facility].  She had an MRI of the abdomen in 09/2020 and this did not show a neuroendocrine tumor.  A secretin stimulation test is planned at [facility]. Since last visit, she has been feeling well, but she gain weight after stopping Optavia diet.  She would like to restart this. She started Lexapro 4 mo ago for mild depression and anxiety.  She feels better on this. Her hair loss improved since last visit.  Reviewed history: Pt. has been dx with hypothyroidism in 01/2014; we did not start levothyroxine yet since she is euthyroid.  Reviewed her TFTs: Lab Results  Component Value Date   TSH 3.36 05/04/2020   TSH 3.24 11/04/2019   TSH 3.62 09/17/2018   TSH 4.41 09/23/2017   TSH 4.26 01/21/2017   FREET4 0.76 05/04/2020   FREET4 0.84 11/04/2019   FREET4 0.76 09/17/2018   FREET4 0.85 09/23/2017   FREET4 0.88 01/21/2017   02/01/2014  -elevated antibodies: TSH 4.89, TT4 7.8 TPO Abs 112 (<34) ATA 5.0 (<0.9)  Pt denies: - feeling nodules in neck - hoarseness - dysphagia - choking - SOB with lying down  Thyroid U/S (09/16/2015): Mild thyromegaly without focal nodule  She has a history of regular but heavy menses.  Previously on iron supplements.  Started Mirena in  03/2018 >> no menses. She would like to have this out.  Husband had vasectomy. Her hair loss started in ~08/2019 and is generalized.  She also has pernicious anemia.  She takes B12.  Brother has Primary Imune deficiency.  ROS: + See HPI  I reviewed pt's medications, allergies, PMH, social hx, family hx, and changes were documented in the history of present illness. Otherwise, unchanged from my initial visit note.  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    Social History Main Topics   Smoking status: Never Smoker    Smokeless tobacco: Not on file   Alcohol Use:     3 -4 wine drinks per week   Drug Use: No    Allergies  Allergen Reactions   Feraheme [Ferumoxytol] Other (See Comments)    Throat felt "thick", neck and facial redness, mouth and throat tingling   Penicillins Rash   Family history: - Negative for diabetes and hypertension - Positive for hyperlipidemia in father - Negative for heart problems - Positive for thyroid problems as mentioned in the history of present illness  PE: BP 118/80 (BP Location: Right Arm, Patient Position: Sitting, Cuff Size: Normal)   Pulse  92   Ht 5\' 4"  (1.626 m)   Wt 144 lb 3.2 oz (65.4 kg)   SpO2 98%   BMI 24.75 kg/m  Body mass index is 24.75 kg/m. Wt Readings from Last 3 Encounters:  02/24/21 144 lb 3.2 oz (65.4 kg)  06/22/20 138 lb 8 oz (62.8 kg)  01/04/20 125 lb (56.7 kg)   Constitutional: normal weight, in NAD Eyes: PERRLA, EOMI, no exophthalmos ENT: moist mucous membranes, + mild symmetric thyromegaly, no cervical lymphadenopathy Cardiovascular: RRR, No MRG Respiratory: CTA B Gastrointestinal: abdomen soft, NT, ND, BS+ Musculoskeletal: no deformities, strength intact in all 4 Skin: moist, warm, no rashes Neurological: no  tremor with outstretched hands, DTR normal in all 4  ASSESSMENT: 1. Hashimoto's Thyroiditis -Euthyroid  2. Thyromegaly -Nonnodular  PLAN:  1. Patient with mild, euthyroid, Hashimoto's thyroiditis -No clear hypothyroid symptoms since last visit: No constipation, fatigue, dry skin, but she did have weight gain after stopping Optavia Diet, and continues to have hair loss, however, this improves.  At last visit we discussed about a hair skin and nails vitamins that she did so, but then started to forget to take it so she is now off completely.  We also discussed about possibly using selenium, but she did not start since last visit. -We reviewed her TFTs from 04/2020 when these were normal -We will recheck these today and we will also add TPO and ATA antibodies -We did discuss that if we need to start levothyroxine, this is taken: - in am - fasting - at least 30 min from b'fast - At least 4 hours from Ca, Fe, MVI, PPIs -We also discussed about criteria for treatment for subclinical hypothyroidism: TSH higher than 10 Desire for pregnancy Signs or symptoms consistent with hypothyroidism -For now, she would be happy to continue without treatment if possible -I will see her back in 1 year, but possibly sooner for labs  2. Thyromegaly -Detected on palpation -No neck compression symptoms -Reviewed her thyroid ultrasound report from 2017: No focal nodules, but mild thyromegaly -We will continue to keep an eye on this, no intervention needed for now  Component     Latest Ref Rng & Units 02/24/2021  Triiodothyronine,Free,Serum     2.3 - 4.2 pg/mL 3.1  T4,Free(Direct)     0.60 - 1.60 ng/dL 13/07/2020  TSH     5.42 - 7.06 uIU/mL 2.95  Normal TFTs.    Component     Latest Ref Rng & Units 02/24/2021  Thyroglobulin Ab     < or = 1 IU/mL 3 (H)  Thyroperoxidase Ab SerPl-aCnc     <9 IU/mL 105 (H)  Antibody levels are slightly lower than before.  13/07/2020, MD PhD [facility]  Endocrinology

## 2021-02-24 NOTE — Patient Instructions (Addendum)
Please stop at the lab.  Please return for another visit in 1 year.

## 2021-02-27 LAB — THYROGLOBULIN ANTIBODY: Thyroglobulin Ab: 3 IU/mL — ABNORMAL HIGH (ref <=1)

## 2021-02-27 LAB — THYROID PEROXIDASE ANTIBODY: Thyroperoxidase Ab SerPl-aCnc: 105 IU/mL — ABNORMAL HIGH (ref <9)

## 2021-03-06 DIAGNOSIS — M549 Dorsalgia, unspecified: Secondary | ICD-10-CM | POA: Diagnosis not present

## 2021-03-06 DIAGNOSIS — N62 Hypertrophy of breast: Secondary | ICD-10-CM | POA: Diagnosis not present

## 2021-03-06 DIAGNOSIS — M542 Cervicalgia: Secondary | ICD-10-CM | POA: Diagnosis not present

## 2021-03-06 DIAGNOSIS — G8929 Other chronic pain: Secondary | ICD-10-CM | POA: Diagnosis not present

## 2021-03-22 DIAGNOSIS — E538 Deficiency of other specified B group vitamins: Secondary | ICD-10-CM | POA: Diagnosis not present

## 2021-03-22 DIAGNOSIS — D509 Iron deficiency anemia, unspecified: Secondary | ICD-10-CM | POA: Diagnosis not present

## 2021-03-22 DIAGNOSIS — E164 Increased secretion of gastrin: Secondary | ICD-10-CM | POA: Diagnosis not present

## 2021-03-28 DIAGNOSIS — F325 Major depressive disorder, single episode, in full remission: Secondary | ICD-10-CM | POA: Diagnosis not present

## 2021-04-26 DIAGNOSIS — E538 Deficiency of other specified B group vitamins: Secondary | ICD-10-CM | POA: Diagnosis not present

## 2021-05-01 DIAGNOSIS — Z124 Encounter for screening for malignant neoplasm of cervix: Secondary | ICD-10-CM | POA: Diagnosis not present

## 2021-05-01 DIAGNOSIS — N951 Menopausal and female climacteric states: Secondary | ICD-10-CM | POA: Diagnosis not present

## 2021-05-01 DIAGNOSIS — Z01419 Encounter for gynecological examination (general) (routine) without abnormal findings: Secondary | ICD-10-CM | POA: Diagnosis not present

## 2021-05-15 ENCOUNTER — Other Ambulatory Visit: Payer: Self-pay | Admitting: Obstetrics and Gynecology

## 2021-05-15 DIAGNOSIS — Z1231 Encounter for screening mammogram for malignant neoplasm of breast: Secondary | ICD-10-CM

## 2021-05-29 DIAGNOSIS — E538 Deficiency of other specified B group vitamins: Secondary | ICD-10-CM | POA: Diagnosis not present

## 2021-06-13 DIAGNOSIS — E164 Increased secretion of gastrin: Secondary | ICD-10-CM | POA: Diagnosis not present

## 2021-06-15 ENCOUNTER — Other Ambulatory Visit: Payer: Self-pay

## 2021-06-15 ENCOUNTER — Ambulatory Visit
Admission: RE | Admit: 2021-06-15 | Discharge: 2021-06-15 | Disposition: A | Discharge status: Home / Self Care | Payer: BC Managed Care – PPO | Source: Ambulatory Visit | Attending: Obstetrics and Gynecology | Admitting: Obstetrics and Gynecology

## 2021-06-15 DIAGNOSIS — Z1231 Encounter for screening mammogram for malignant neoplasm of breast: Secondary | ICD-10-CM

## 2021-06-26 DIAGNOSIS — E538 Deficiency of other specified B group vitamins: Secondary | ICD-10-CM | POA: Diagnosis not present

## 2021-07-10 DIAGNOSIS — K317 Polyp of stomach and duodenum: Secondary | ICD-10-CM | POA: Diagnosis not present

## 2021-07-10 DIAGNOSIS — E164 Increased secretion of gastrin: Secondary | ICD-10-CM | POA: Diagnosis not present

## 2021-07-10 DIAGNOSIS — Z79899 Other long term (current) drug therapy: Secondary | ICD-10-CM | POA: Diagnosis not present

## 2021-07-10 DIAGNOSIS — K3189 Other diseases of stomach and duodenum: Secondary | ICD-10-CM | POA: Diagnosis not present

## 2021-07-10 DIAGNOSIS — Z793 Long term (current) use of hormonal contraceptives: Secondary | ICD-10-CM | POA: Diagnosis not present

## 2021-07-10 DIAGNOSIS — Z8719 Personal history of other diseases of the digestive system: Secondary | ICD-10-CM | POA: Diagnosis not present

## 2021-07-10 DIAGNOSIS — Z880 Allergy status to penicillin: Secondary | ICD-10-CM | POA: Diagnosis not present

## 2021-07-21 DIAGNOSIS — Z719 Counseling, unspecified: Secondary | ICD-10-CM | POA: Diagnosis not present

## 2021-07-21 DIAGNOSIS — L989 Disorder of the skin and subcutaneous tissue, unspecified: Secondary | ICD-10-CM | POA: Diagnosis not present

## 2021-07-21 DIAGNOSIS — N62 Hypertrophy of breast: Secondary | ICD-10-CM | POA: Diagnosis not present

## 2021-07-22 HISTORY — PX: REDUCTION MAMMAPLASTY: SUR839

## 2021-07-27 DIAGNOSIS — E538 Deficiency of other specified B group vitamins: Secondary | ICD-10-CM | POA: Diagnosis not present

## 2021-08-25 DIAGNOSIS — E538 Deficiency of other specified B group vitamins: Secondary | ICD-10-CM | POA: Diagnosis not present

## 2021-09-26 DIAGNOSIS — Z0000 Encounter for general adult medical examination without abnormal findings: Secondary | ICD-10-CM | POA: Diagnosis not present

## 2021-09-26 DIAGNOSIS — Z13220 Encounter for screening for lipoid disorders: Secondary | ICD-10-CM | POA: Diagnosis not present

## 2021-09-26 DIAGNOSIS — F411 Generalized anxiety disorder: Secondary | ICD-10-CM | POA: Diagnosis not present

## 2021-09-26 DIAGNOSIS — F325 Major depressive disorder, single episode, in full remission: Secondary | ICD-10-CM | POA: Diagnosis not present

## 2021-09-26 DIAGNOSIS — E538 Deficiency of other specified B group vitamins: Secondary | ICD-10-CM | POA: Diagnosis not present

## 2021-10-26 IMAGING — CT CT ABD-PELV W/ CM
1 of 3 series · 13 of 32 positions shown, 19 images · IV contrast (APPLIED)
Comparison: None.

CLINICAL DATA: Elevated gastrin level.

EXAM:
CT ABDOMEN AND PELVIS WITH CONTRAST
TECHNIQUE: Multidetector CT imaging of the abdomen and pelvis was performed
using the standard protocol following bolus administration of
intravenous contrast.
CONTRAST:  100mL OR9L9M-DHH IOPAMIDOL (OR9L9M-DHH) INJECTION 61%

[Series 2: abd/pelvis w/cm · axial · 0.71mm/px · z∈[-452,-62]mm · 13 of 90 slices shown, 19 images]
[im 6/90  soft-tissue]
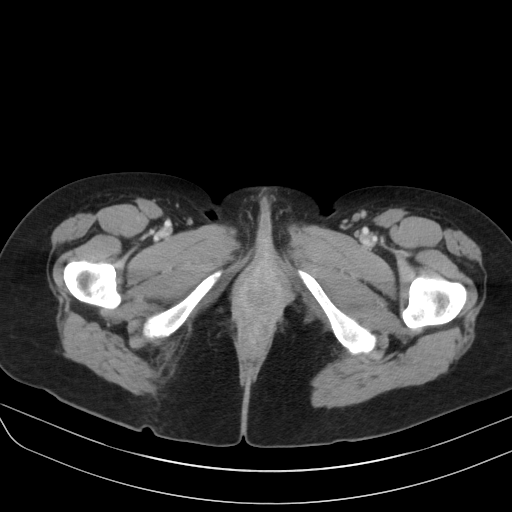
[im 6/90  bone]
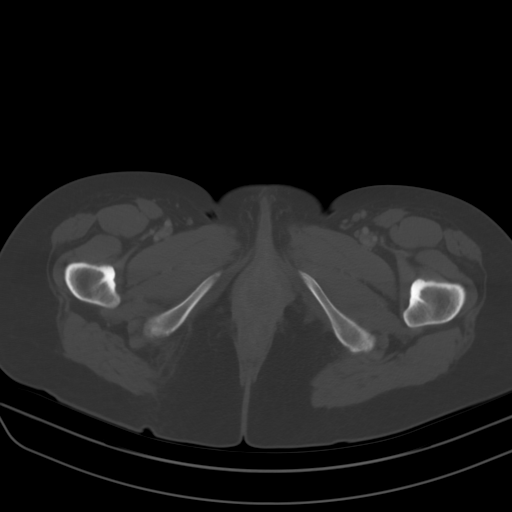
[im 12/90  soft-tissue]
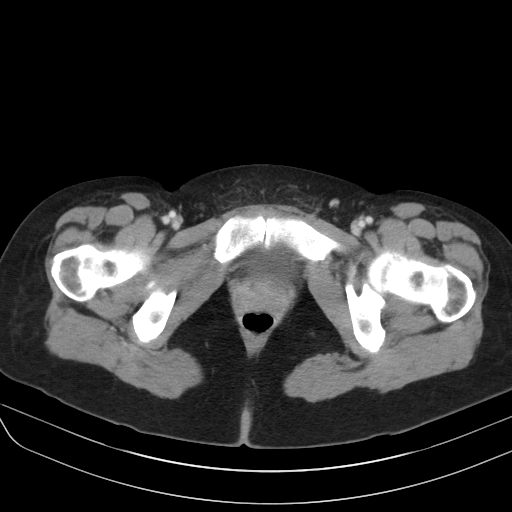
[im 18/90  soft-tissue]
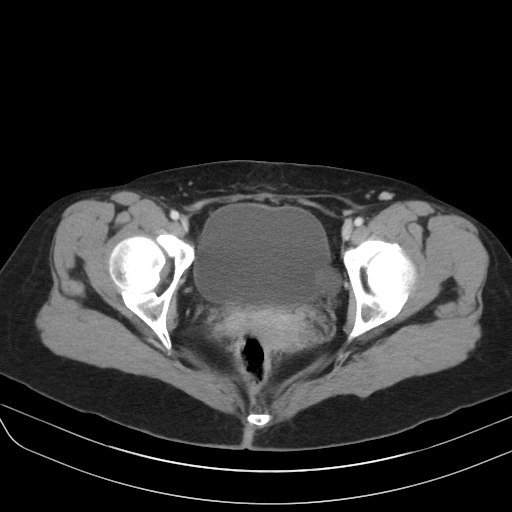
[im 24/90  soft-tissue]
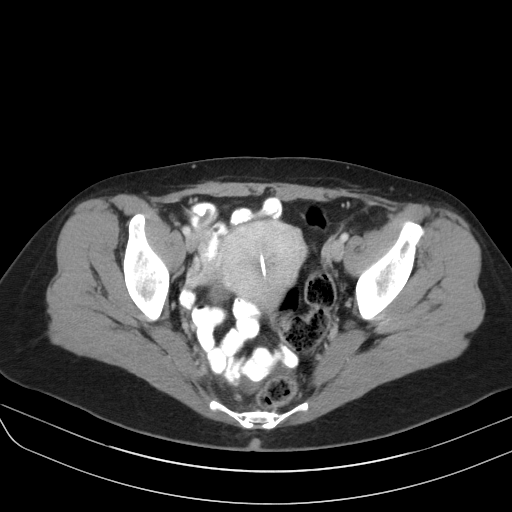
[im 30/90  soft-tissue]
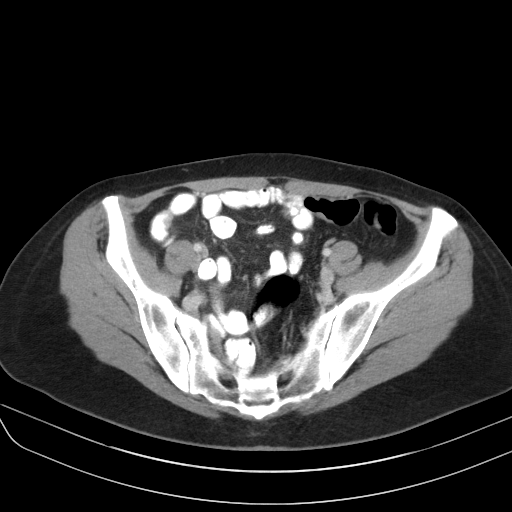
[im 36/90  soft-tissue]
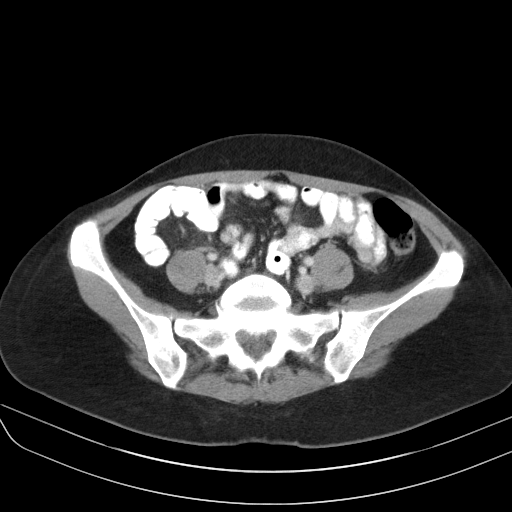
[im 48/90  soft-tissue]
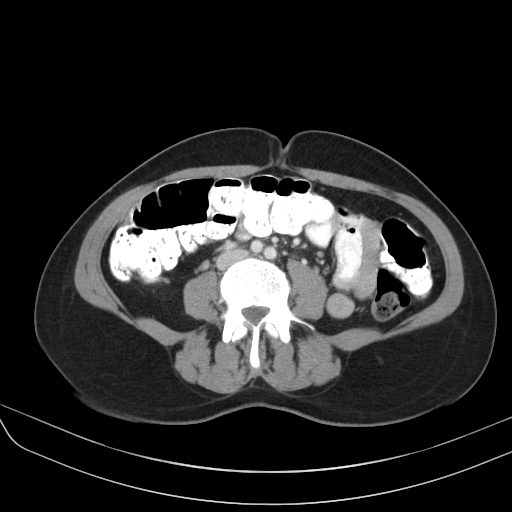
[im 54/90  soft-tissue]
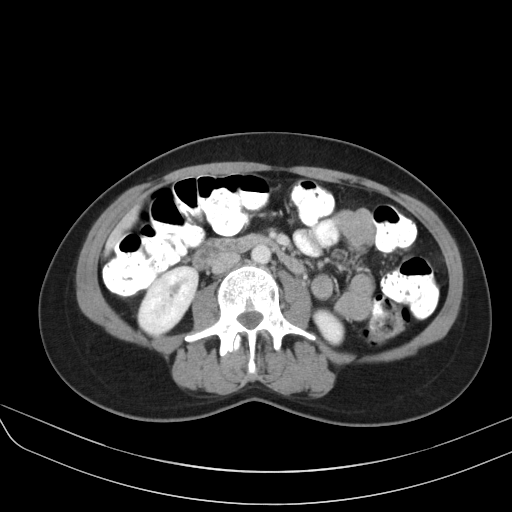
[im 60/90  soft-tissue]
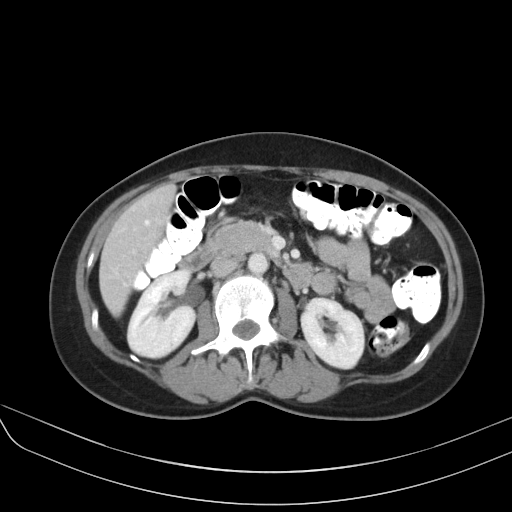
[im 60/90  bone]
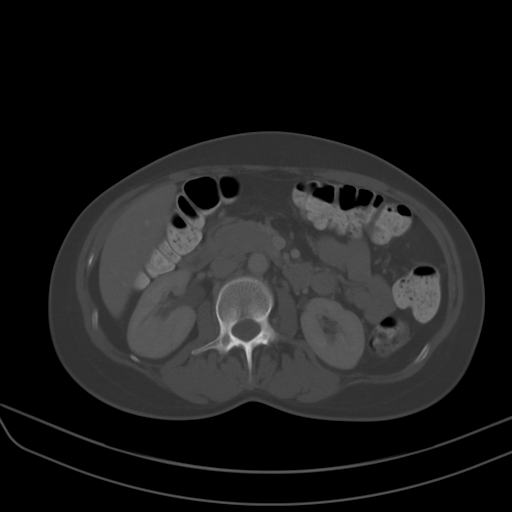
[im 66/90  soft-tissue]
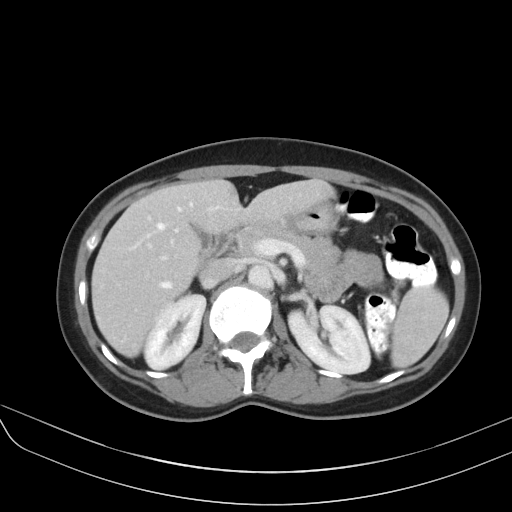
[im 66/90  lung]
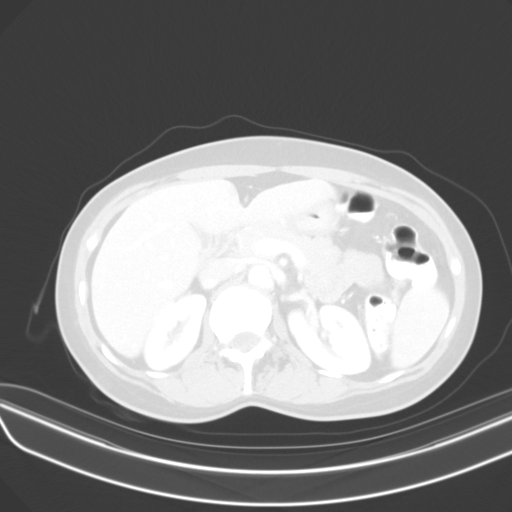
[im 72/90  soft-tissue]
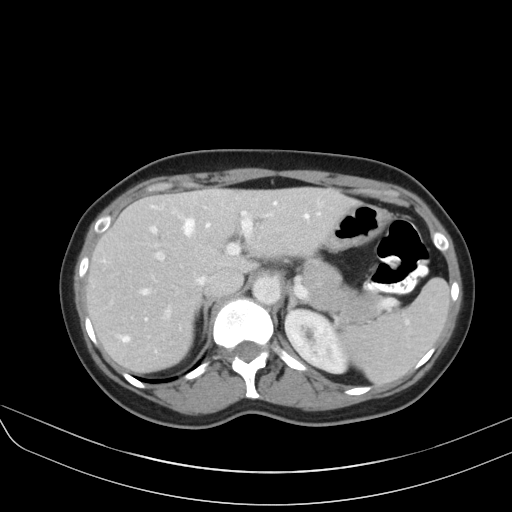
[im 72/90  lung]
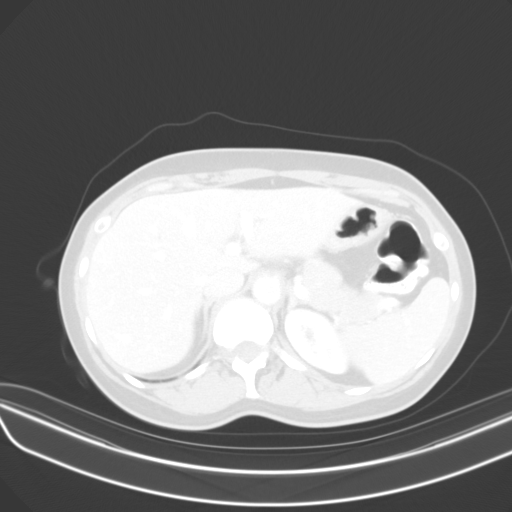
[im 78/90  soft-tissue]
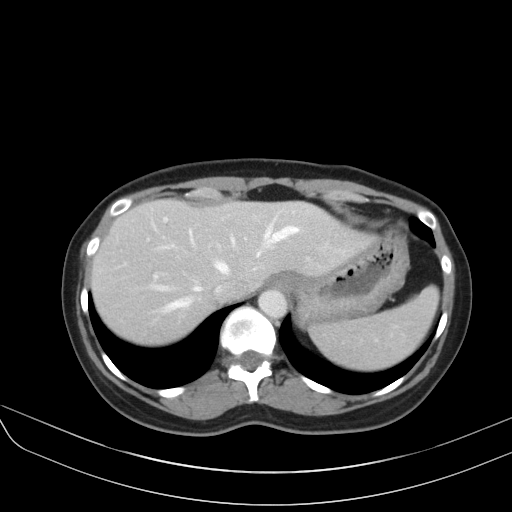
[im 78/90  lung]
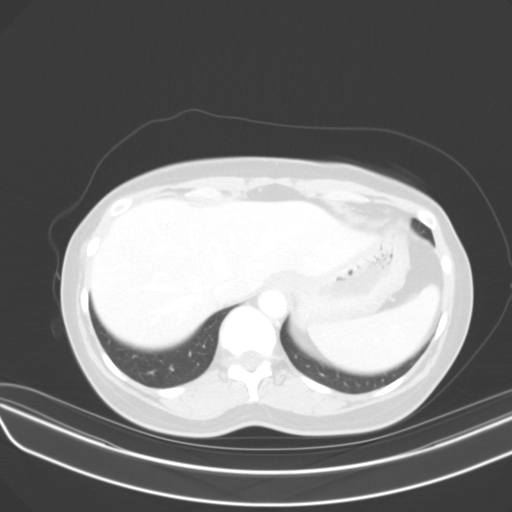
[im 84/90  soft-tissue]
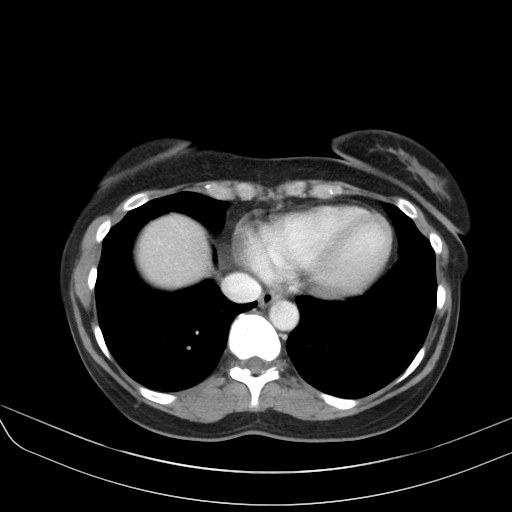
[im 84/90  lung]
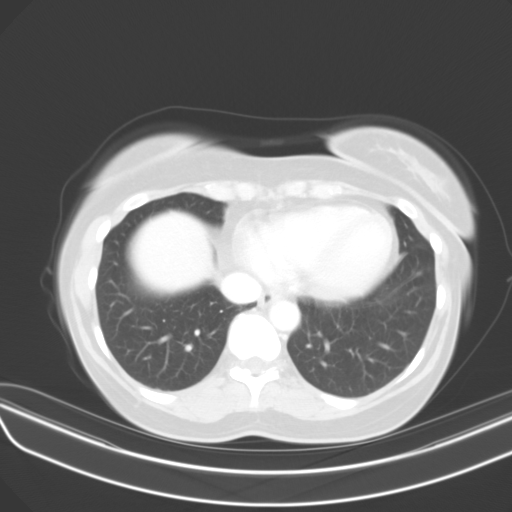

[13 of 32 positions shown; findings below may reference images not displayed]

FINDINGS: Lower chest: No acute abnormality.

Hepatobiliary: No focal liver abnormality is seen. No gallstones,
gallbladder wall thickening, or biliary dilatation.

Pancreas: Unremarkable. No focal pancreatic lesion identified on the
venous phase study or on delayed imaging. Arterial phase of imaging
was not performed on the current study. No pancreatic ductal
dilatation or surrounding inflammatory changes.

Spleen: Normal in size without focal abnormality.

Adrenals/Urinary Tract: Adrenal glands are unremarkable. Kidneys are
normal, without renal calculi, focal lesion, or hydronephrosis.
Bladder is unremarkable.

Stomach/Bowel: Bowel shows no evidence of obstruction, ileus, lesion
or inflammatory process. The appendix is normal. No free
intraperitoneal air identified.

Vascular/Lymphatic: No significant vascular findings are present. No
enlarged abdominal or pelvic lymph nodes.

Reproductive: Uterus and bilateral adnexa are unremarkable. IUD
present with appropriate positioning in the endometrial cavity.
Bilateral adnexal cysts appear physiologic.

Other: No abdominal wall hernia or abnormality. No abdominopelvic
ascites.

Musculoskeletal: No acute or significant osseous findings.
IMPRESSION: No acute findings in the abdomen or pelvis. No focal pancreatic
lesion identified. MRI of the abdomen with and without gadolinium
would be more sensitive in detection of gastrinoma/islet cell tumor
if there remains clinical suspicion.

## 2021-11-02 DIAGNOSIS — E538 Deficiency of other specified B group vitamins: Secondary | ICD-10-CM | POA: Diagnosis not present

## 2021-12-05 DIAGNOSIS — E538 Deficiency of other specified B group vitamins: Secondary | ICD-10-CM | POA: Diagnosis not present

## 2022-01-08 DIAGNOSIS — E538 Deficiency of other specified B group vitamins: Secondary | ICD-10-CM | POA: Diagnosis not present

## 2022-02-08 DIAGNOSIS — D510 Vitamin B12 deficiency anemia due to intrinsic factor deficiency: Secondary | ICD-10-CM | POA: Diagnosis not present

## 2022-03-01 ENCOUNTER — Ambulatory Visit (INDEPENDENT_AMBULATORY_CARE_PROVIDER_SITE_OTHER): Payer: BC Managed Care – PPO | Admitting: Internal Medicine

## 2022-03-01 ENCOUNTER — Encounter: Payer: Self-pay | Admitting: Internal Medicine

## 2022-03-01 VITALS — BP 128/82 | HR 76 | Ht 64.0 in | Wt 149.8 lb

## 2022-03-01 DIAGNOSIS — E063 Autoimmune thyroiditis: Secondary | ICD-10-CM | POA: Diagnosis not present

## 2022-03-01 DIAGNOSIS — E010 Iodine-deficiency related diffuse (endemic) goiter: Secondary | ICD-10-CM | POA: Diagnosis not present

## 2022-03-01 LAB — T3, FREE: T3, Free: 3.2 pg/mL (ref 2.3–4.2)

## 2022-03-01 LAB — TSH: TSH: 3.14 u[IU]/mL (ref 0.35–5.50)

## 2022-03-01 LAB — T4, FREE: Free T4: 0.74 ng/dL (ref 0.60–1.60)

## 2022-03-01 NOTE — Progress Notes (Signed)
Continue patient ID: Karen Guzman, female   DOB: [birth year], 51 y.o.   MRN: [id number]  HPI  Karen Guzman is a 51 y.o.-year-old female, returning for f/u for Hashimoto's hypothyroidism.  Last visit was 1 year ago.  Interim history: Before last visit, she was found to have an elevated gastrin level.  Of note, she does have a history of pernicious anemia.  She is seen by [facility] and [facility].  She had an MRI of the abdomen in 09/2020 and this did not show a neuroendocrine tumor. Before last visit, she gained weight after stopping Optavia diet.  She was planning to restart. Now off.  She started intermittent fasting with her husband -fasting window in the morning,. Since last OV, she gained 5 lbs.  Reviewed history: Pt. has been dx with hypothyroidism in 01/2014; we did not start levothyroxine yet since she is euthyroid.  Reviewed her TFTs: Lab Results  Component Value Date   TSH 2.95 02/24/2021   TSH 3.36 05/04/2020   TSH 3.24 11/04/2019   TSH 3.62 09/17/2018   TSH 4.41 09/23/2017   FREET4 0.71 02/24/2021   FREET4 0.76 05/04/2020   FREET4 0.84 11/04/2019   FREET4 0.76 09/17/2018   FREET4 0.85 09/23/2017  02/01/2014: TSH 4.89, TT4 7.8  Thyroid antibodies were elevated: Component     Latest Ref Rng & Units 02/24/2021  Thyroglobulin Ab     < or = 1 IU/mL 3 (H)  Thyroperoxidase Ab SerPl-aCnc     <9 IU/mL 105 (H)  02/01/2014: TPO Abs 112 (<34), ATA 5.0 (<0.9)  Pt denies: - feeling nodules in neck - hoarseness - dysphagia - choking  Thyroid U/S (09/16/2015): Mild thyromegaly without focal nodule  She has a history of regular but heavy menses.  Previously on iron supplements.  Started Mirena in 03/2018 >> no menses >> out.  Husband had vasectomy. Her hair loss started in ~08/2019 and is generalized.  She also has pernicious anemia.  She takes B12 im.  Brother has Primary Imune deficiency.  ROS: + See HPI  I reviewed pt's medications, allergies, PMH, social hx, family  hx, and changes were documented in the history of present illness. Otherwise, unchanged from my initial visit note.  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    Social History Main Topics   Smoking status: Never Smoker    Smokeless tobacco: Not on file   Alcohol Use:     3 -4 wine drinks per week   Drug Use: No    Allergies  Allergen Reactions   Feraheme [Ferumoxytol] Other (See Comments)    Throat felt "thick", neck and facial redness, mouth and throat tingling   Penicillins Rash   Family history: - Negative for diabetes and hypertension - Positive for hyperlipidemia in father - Negative for heart problems - Positive for thyroid problems as mentioned in the history of present illness  PE: BP 128/82 (BP Location: Right Arm, Patient Position: Sitting, Cuff Size: Normal)   Pulse 76   Ht 5\' 4"  (1.626 m)   Wt 149 lb 12.8 oz (67.9 kg)   SpO2 99%   BMI 25.71 kg/m   Wt Readings from Last 3 Encounters:  03/01/22 149 lb 12.8 oz (67.9 kg)  02/24/21 144 lb 3.2 oz (65.4 kg)  06/22/20 138 lb 8 oz (62.8 kg)   Constitutional: Normal weight, in NAD Eyes:  EOMI, no exophthalmos ENT: + Mild, symmetric, thyromegaly; no cervical lymphadenopathy Cardiovascular: RRR, No MRG Respiratory: CTA B Musculoskeletal: no deformities Skin:no rashes Neurological: no tremor with outstretched hands  ASSESSMENT: 1. Hashimoto's Thyroiditis -Euthyroid  2. Thyromegaly -Nonnodular  3.  Overweight  PLAN:  1. Patient with mild, euthyroid, Hashimoto's thyroiditis -Patient with elevated antithyroid antibodies but with normal thyroid function tests -No clear hypothyroid symptoms but she did have weight gain at last visit along with hair loss (improving).  At this visit, she has a 4 pound weight gain.   She -At last visit I recommended to try selenium but she did not start this -TFTs were normal at last check a year ago.  We will repeat these today. -At last visit we also checked her antithyroid antibodies. We will not repeat these today. -We discussed that if we need to start levothyroxine, to take the thyroid hormone every day, with water, at least 30 minutes before breakfast, separated by at least 4 hours from: - acid reflux medications - calcium - iron - multivitamins -She is aware about criteria for treatment for subclinical hypothyroidism: TSH higher than 10 Desire for pregnancy Signs or symptoms consistent with hypothyroidism -Her preference is to continue without treatment, if possible -I we will see her back in a year, possibly sooner for labs  2. Thyromegaly -Detected on palpation -She denies neck compression symptoms -Reviewed her thyroid ultrasound report from 2017: No focal nodules but does have mild thyromegaly -No intervention needed for now, we will continue to keep an eye on this  3.  Overweight -She started intermittent fasting with the fasting window  in the morning.  We discussed that a pm/evening fasting window is healthier, but she is not able to do this as she wants to eat dinner with her family. -I recommended further reference for intermittent fasting  Component     Latest Ref Rng 03/01/2022  TSH     0.35 - 5.50 uIU/mL 3.14   T4,Free(Direct)     0.60 - 1.60 ng/dL 7.51   Triiodothyronine,Free,Serum     2.3 - 4.2 pg/mL 3.2    Normal TFTs.  Carlus Pavlov, MD PhD [facility] Endocrinology

## 2022-03-01 NOTE — Patient Instructions (Addendum)
Please stop at the lab.  Read the following book: Dr. Wylene Simmer - The Obesity Code   You should have an endocrinology follow-up appointment in 1 year.

## 2022-05-03 DIAGNOSIS — Z01419 Encounter for gynecological examination (general) (routine) without abnormal findings: Secondary | ICD-10-CM | POA: Diagnosis not present

## 2022-05-17 ENCOUNTER — Other Ambulatory Visit: Payer: Self-pay | Admitting: Obstetrics and Gynecology

## 2022-05-17 DIAGNOSIS — Z1231 Encounter for screening mammogram for malignant neoplasm of breast: Secondary | ICD-10-CM

## 2022-06-01 DIAGNOSIS — F411 Generalized anxiety disorder: Secondary | ICD-10-CM | POA: Diagnosis not present

## 2022-06-01 DIAGNOSIS — F325 Major depressive disorder, single episode, in full remission: Secondary | ICD-10-CM | POA: Diagnosis not present

## 2022-07-12 ENCOUNTER — Ambulatory Visit
Admission: RE | Admit: 2022-07-12 | Discharge: 2022-07-12 | Disposition: A | Discharge status: Home / Self Care | Payer: BLUE CROSS/BLUE SHIELD | Source: Ambulatory Visit | Attending: Obstetrics and Gynecology | Admitting: Obstetrics and Gynecology

## 2022-07-12 DIAGNOSIS — Z1231 Encounter for screening mammogram for malignant neoplasm of breast: Secondary | ICD-10-CM | POA: Diagnosis not present

## 2022-08-10 DIAGNOSIS — L570 Actinic keratosis: Secondary | ICD-10-CM | POA: Diagnosis not present

## 2022-08-10 DIAGNOSIS — D2262 Melanocytic nevi of left upper limb, including shoulder: Secondary | ICD-10-CM | POA: Diagnosis not present

## 2022-08-10 DIAGNOSIS — D2372 Other benign neoplasm of skin of left lower limb, including hip: Secondary | ICD-10-CM | POA: Diagnosis not present

## 2022-08-10 DIAGNOSIS — L821 Other seborrheic keratosis: Secondary | ICD-10-CM | POA: Diagnosis not present

## 2022-09-06 DIAGNOSIS — Z6826 Body mass index (BMI) 26.0-26.9, adult: Secondary | ICD-10-CM | POA: Diagnosis not present

## 2022-09-06 DIAGNOSIS — W57XXXA Bitten or stung by nonvenomous insect and other nonvenomous arthropods, initial encounter: Secondary | ICD-10-CM | POA: Diagnosis not present

## 2022-09-06 DIAGNOSIS — L089 Local infection of the skin and subcutaneous tissue, unspecified: Secondary | ICD-10-CM | POA: Diagnosis not present

## 2022-09-06 DIAGNOSIS — S80862A Insect bite (nonvenomous), left lower leg, initial encounter: Secondary | ICD-10-CM | POA: Diagnosis not present

## 2022-12-19 DIAGNOSIS — M2550 Pain in unspecified joint: Secondary | ICD-10-CM | POA: Diagnosis not present

## 2022-12-19 DIAGNOSIS — F411 Generalized anxiety disorder: Secondary | ICD-10-CM | POA: Diagnosis not present

## 2022-12-19 DIAGNOSIS — M79672 Pain in left foot: Secondary | ICD-10-CM | POA: Diagnosis not present

## 2022-12-19 DIAGNOSIS — F325 Major depressive disorder, single episode, in full remission: Secondary | ICD-10-CM | POA: Diagnosis not present

## 2023-03-05 ENCOUNTER — Encounter: Payer: Self-pay | Admitting: Internal Medicine

## 2023-03-05 ENCOUNTER — Ambulatory Visit (INDEPENDENT_AMBULATORY_CARE_PROVIDER_SITE_OTHER): Payer: BC Managed Care – PPO | Admitting: Internal Medicine

## 2023-03-05 VITALS — BP 122/64 | HR 65 | Ht 64.0 in | Wt 152.8 lb

## 2023-03-05 DIAGNOSIS — E063 Autoimmune thyroiditis: Secondary | ICD-10-CM

## 2023-03-05 DIAGNOSIS — E010 Iodine-deficiency related diffuse (endemic) goiter: Secondary | ICD-10-CM

## 2023-03-05 LAB — T4, FREE: Free T4: 0.71 ng/dL (ref 0.60–1.60)

## 2023-03-05 LAB — TSH: TSH: 3.76 u[IU]/mL (ref 0.35–5.50)

## 2023-03-05 LAB — T3, FREE: T3, Free: 3.6 pg/mL (ref 2.3–4.2)

## 2023-03-05 NOTE — Progress Notes (Signed)
Continue patient ID: Karen Guzman, female   DOB: October 09, 1970, 52 y.o.   MRN: [id number]  HPI  Karen Guzman is a 52 y.o.-year-old female, returning for f/u for Hashimoto's hypothyroidism.  Last visit was 1 year ago.  Interim history: Before last visit she started intermittent fasting with her husband (fasting window in the morning).  At that time, she had gained 5 pounds after stopping Optavia diet.  At this visit, she gained 3 pounds since last visit. She has been more stressed since last visit with both of her in-laws being sick and in the hospital.  They are doing better. She denies cold intolerance, increased fatigue, dry skin, hair loss, constipation.  Reviewed history: Pt. has been dx with hypothyroidism in 01/2014; we did not start levothyroxine yet since she is euthyroid.  Reviewed her TFTs: Lab Results  Component Value Date   TSH 3.14 03/01/2022   TSH 2.95 02/24/2021   TSH 3.36 05/04/2020   TSH 3.24 11/04/2019   TSH 3.62 09/17/2018   FREET4 0.74 03/01/2022   FREET4 0.71 02/24/2021   FREET4 0.76 05/04/2020   FREET4 0.84 11/04/2019   FREET4 0.76 09/17/2018  02/01/2014: TSH 4.89, TT4 7.8  Thyroid antibodies were elevated: Component     Latest Ref Rng & Units 02/24/2021  Thyroglobulin Ab     < or = 1 IU/mL 3 (H)  Thyroperoxidase Ab SerPl-aCnc     <9 IU/mL 105 (H)  02/01/2014: TPO Abs 112 (<34), ATA 5.0 (<0.9)  Pt denies: - feeling nodules in neck - hoarseness - dysphagia - choking  Thyroid U/S (09/16/2015): Mild thyromegaly without focal nodule  She has a history of regular but heavy menses.  Previously on iron supplements.  Started Mirena in 03/2018 >> no menses >> out.  Husband had vasectomy. Her hair loss started in ~08/2019 and b/c generalized.  This has improved. She also has pernicious anemia.  She takes B12 im. In 2022, she was found to have an elevated gastrin level.  Of note, she does have a history of pernicious anemia.  She is seen by [facility] and [facility].  She had an MRI of the abdomen in 09/2020 and this did not show a neuroendocrine tumor.  Brother has Primary Imune deficiency.  ROS: + See HPI  I reviewed pt's medications, allergies, PMH, social hx, family hx, and changes were documented in the history of present illness. Otherwise, unchanged from my initial visit note.  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    Social History Main Topics   Smoking status: Never Smoker    Smokeless tobacco: Not on file   Alcohol Use:     3 -4 wine drinks per week   Drug Use: No    Allergies  Allergen Reactions   Feraheme [Ferumoxytol] Other (See Comments)    Throat felt "thick", neck and facial redness, mouth and throat tingling   Penicillins Rash   Family history: - Negative for diabetes and hypertension - Positive for hyperlipidemia in father - Negative for heart problems - Positive for thyroid problems as mentioned in the history of present illness  PE: BP 122/64   Pulse 65   Ht 5\' 4"  (1.626 m)   Wt 152 lb 12.8  oz (69.3 kg)   SpO2 98%   BMI 26.23 kg/m   Wt Readings from Last 3 Encounters:  03/05/23 152 lb 12.8 oz (69.3 kg)  03/01/22 149 lb 12.8 oz (67.9 kg)  02/24/21 144 lb 3.2 oz (65.4 kg)   Constitutional: Normal weight, in NAD Eyes:  EOMI, no exophthalmos ENT: + Mild, symmetric, thyromegaly; no cervical lymphadenopathy Cardiovascular: RRR, No MRG Respiratory: CTA B Musculoskeletal: no deformities Skin:no rashes Neurological: no tremor with outstretched hands  ASSESSMENT: 1. Hashimoto's Thyroiditis -Euthyroid  2. Thyromegaly -Nonnodular  PLAN:  1. Patient with mild, euthyroid, Hashimoto's thyroiditis -She has a history of elevated antithyroid antibodies but normal thyroid function tests -No clear hypothyroid  symptoms but has gradual weight gain and hair loss-improved. -I previously recommended selenium but she did not try this -TFTs were normal at last visit and we will repeat these today -We discussed that if we need to start levothyroxine, this is taken every day, with water, at least 30 minutes before breakfast, separated by at least 4h from: - acid reflux medications - calcium - iron - multivitamins -criteria for treatment for subclinical hypothyroidism TSH higher than 10 Desire for pregnancy Signs or symptoms consistent with hypothyroidism -Her preference is to continue without treatment if possible -she is wondering whether she can start seeing PCP for this problem.  I recommended to have a TSH checked annually and return to see me if this increases. -I we will see her back as needed in the future.  2. Thyromegaly -Detected on palpation -No neck compression symptoms -Latest thyroid ultrasound report from 2017 showed no focal nodules but she does have mild thyromegaly -I advised her that no further imaging is needed for this problem unless she has a significant increase in her goiter size or if she has new neck compression symptoms.  Component     Latest Ref Rng 03/05/2023  TSH     0.35 - 5.50 uIU/mL 3.76   T4,Free(Direct)     0.60 - 1.60 ng/dL 9.14   Triiodothyronine,Free,Serum     2.3 - 4.2 pg/mL 3.6   Normal TFTs.  Carlus Pavlov, MD PhD [facility] Endocrinology

## 2023-03-05 NOTE — Patient Instructions (Addendum)
Please stop at the lab.  Please return to see me as needed. 

## 2023-06-21 ENCOUNTER — Other Ambulatory Visit: Payer: Self-pay | Admitting: Obstetrics and Gynecology

## 2023-06-21 DIAGNOSIS — Z1231 Encounter for screening mammogram for malignant neoplasm of breast: Secondary | ICD-10-CM

## 2023-07-17 ENCOUNTER — Ambulatory Visit: Payer: BC Managed Care – PPO

## 2023-07-17 ENCOUNTER — Ambulatory Visit
Admission: RE | Admit: 2023-07-17 | Discharge: 2023-07-17 | Disposition: A | Discharge status: Home / Self Care | Source: Ambulatory Visit | Attending: Obstetrics and Gynecology | Admitting: Obstetrics and Gynecology

## 2023-07-17 DIAGNOSIS — Z1231 Encounter for screening mammogram for malignant neoplasm of breast: Secondary | ICD-10-CM | POA: Diagnosis not present

## 2023-07-22 DIAGNOSIS — Z01419 Encounter for gynecological examination (general) (routine) without abnormal findings: Secondary | ICD-10-CM | POA: Diagnosis not present

## 2023-07-22 DIAGNOSIS — R61 Generalized hyperhidrosis: Secondary | ICD-10-CM | POA: Diagnosis not present

## 2023-07-22 DIAGNOSIS — N951 Menopausal and female climacteric states: Secondary | ICD-10-CM | POA: Diagnosis not present

## 2023-07-29 DIAGNOSIS — M25561 Pain in right knee: Secondary | ICD-10-CM | POA: Diagnosis not present

## 2023-08-14 DIAGNOSIS — D225 Melanocytic nevi of trunk: Secondary | ICD-10-CM | POA: Diagnosis not present

## 2023-08-14 DIAGNOSIS — D2272 Melanocytic nevi of left lower limb, including hip: Secondary | ICD-10-CM | POA: Diagnosis not present

## 2023-08-14 DIAGNOSIS — L814 Other melanin hyperpigmentation: Secondary | ICD-10-CM | POA: Diagnosis not present

## 2023-08-14 DIAGNOSIS — L570 Actinic keratosis: Secondary | ICD-10-CM | POA: Diagnosis not present

## 2024-02-12 DIAGNOSIS — F325 Major depressive disorder, single episode, in full remission: Secondary | ICD-10-CM | POA: Diagnosis not present

## 2024-02-12 DIAGNOSIS — F411 Generalized anxiety disorder: Secondary | ICD-10-CM | POA: Diagnosis not present

## 2024-02-12 DIAGNOSIS — Z23 Encounter for immunization: Secondary | ICD-10-CM | POA: Diagnosis not present

## 2024-03-04 DIAGNOSIS — Z0000 Encounter for general adult medical examination without abnormal findings: Secondary | ICD-10-CM | POA: Diagnosis not present

## 2024-03-04 DIAGNOSIS — E010 Iodine-deficiency related diffuse (endemic) goiter: Secondary | ICD-10-CM | POA: Diagnosis not present

## 2024-03-04 DIAGNOSIS — Z13220 Encounter for screening for lipoid disorders: Secondary | ICD-10-CM | POA: Diagnosis not present

## 2024-03-04 DIAGNOSIS — Z1331 Encounter for screening for depression: Secondary | ICD-10-CM | POA: Diagnosis not present

## 2024-03-04 DIAGNOSIS — F419 Anxiety disorder, unspecified: Secondary | ICD-10-CM | POA: Diagnosis not present

## 2024-03-04 DIAGNOSIS — E063 Autoimmune thyroiditis: Secondary | ICD-10-CM | POA: Diagnosis not present

## 2024-03-05 DIAGNOSIS — Z13220 Encounter for screening for lipoid disorders: Secondary | ICD-10-CM | POA: Diagnosis not present

## 2024-03-05 DIAGNOSIS — E063 Autoimmune thyroiditis: Secondary | ICD-10-CM | POA: Diagnosis not present

## 2024-03-05 DIAGNOSIS — R7301 Impaired fasting glucose: Secondary | ICD-10-CM | POA: Diagnosis not present

## 2024-03-13 DIAGNOSIS — Z6827 Body mass index (BMI) 27.0-27.9, adult: Secondary | ICD-10-CM | POA: Diagnosis not present

## 2024-03-13 DIAGNOSIS — R7309 Other abnormal glucose: Secondary | ICD-10-CM | POA: Diagnosis not present

## 2024-03-13 DIAGNOSIS — E559 Vitamin D deficiency, unspecified: Secondary | ICD-10-CM | POA: Diagnosis not present

## 2024-03-13 DIAGNOSIS — N951 Menopausal and female climacteric states: Secondary | ICD-10-CM | POA: Diagnosis not present

## 2024-03-13 DIAGNOSIS — F419 Anxiety disorder, unspecified: Secondary | ICD-10-CM | POA: Diagnosis not present

## 2024-03-13 DIAGNOSIS — D649 Anemia, unspecified: Secondary | ICD-10-CM | POA: Diagnosis not present

## 2024-03-13 DIAGNOSIS — E782 Mixed hyperlipidemia: Secondary | ICD-10-CM | POA: Diagnosis not present

## 2024-03-13 DIAGNOSIS — R635 Abnormal weight gain: Secondary | ICD-10-CM | POA: Diagnosis not present

## 2024-03-24 DIAGNOSIS — N951 Menopausal and female climacteric states: Secondary | ICD-10-CM | POA: Diagnosis not present

## 2024-03-24 DIAGNOSIS — E063 Autoimmune thyroiditis: Secondary | ICD-10-CM | POA: Diagnosis not present

## 2024-03-24 DIAGNOSIS — E559 Vitamin D deficiency, unspecified: Secondary | ICD-10-CM | POA: Diagnosis not present

## 2024-03-24 DIAGNOSIS — E782 Mixed hyperlipidemia: Secondary | ICD-10-CM | POA: Diagnosis not present

## 2024-03-31 DIAGNOSIS — D649 Anemia, unspecified: Secondary | ICD-10-CM | POA: Diagnosis not present

## 2024-03-31 DIAGNOSIS — E663 Overweight: Secondary | ICD-10-CM | POA: Diagnosis not present

## 2024-04-03 DIAGNOSIS — K2940 Chronic atrophic gastritis without bleeding: Secondary | ICD-10-CM | POA: Diagnosis not present

## 2024-04-03 DIAGNOSIS — R41840 Attention and concentration deficit: Secondary | ICD-10-CM | POA: Diagnosis not present

## 2024-04-07 DIAGNOSIS — K2940 Chronic atrophic gastritis without bleeding: Secondary | ICD-10-CM | POA: Diagnosis not present

## 2024-04-08 DIAGNOSIS — N951 Menopausal and female climacteric states: Secondary | ICD-10-CM | POA: Diagnosis not present

## 2024-04-08 DIAGNOSIS — R41840 Attention and concentration deficit: Secondary | ICD-10-CM | POA: Diagnosis not present

## 2024-04-08 DIAGNOSIS — F419 Anxiety disorder, unspecified: Secondary | ICD-10-CM | POA: Diagnosis not present
